# Patient Record
Sex: Female | Born: 1990 | Race: Black or African American | Hispanic: No | Marital: Single | Smoking: Never smoker
Health system: Southern US, Community
[De-identification: ages and names within clinical notes are randomized; demographics above are authoritative.]

## PROBLEM LIST (undated history)

## (undated) DIAGNOSIS — D571 Sickle-cell disease without crisis: Secondary | ICD-10-CM

## (undated) DIAGNOSIS — Z789 Other specified health status: Secondary | ICD-10-CM

## (undated) HISTORY — PX: NO PAST SURGERIES: SHX2092

## (undated) HISTORY — DX: Other specified health status: Z78.9

---

## 2013-03-10 ENCOUNTER — Emergency Department (HOSPITAL_COMMUNITY)
Admission: EM | Admit: 2013-03-10 | Discharge: 2013-03-11 | Disposition: A | Discharge status: Home / Self Care | Payer: BC Managed Care – PPO | Attending: Emergency Medicine | Admitting: Emergency Medicine

## 2013-03-10 DIAGNOSIS — R51 Headache: Secondary | ICD-10-CM | POA: Insufficient documentation

## 2013-03-10 DIAGNOSIS — J029 Acute pharyngitis, unspecified: Secondary | ICD-10-CM | POA: Insufficient documentation

## 2013-03-10 DIAGNOSIS — R05 Cough: Secondary | ICD-10-CM | POA: Insufficient documentation

## 2013-03-10 DIAGNOSIS — R059 Cough, unspecified: Secondary | ICD-10-CM | POA: Insufficient documentation

## 2013-03-10 DIAGNOSIS — R509 Fever, unspecified: Secondary | ICD-10-CM | POA: Insufficient documentation

## 2013-03-10 DIAGNOSIS — D571 Sickle-cell disease without crisis: Secondary | ICD-10-CM | POA: Insufficient documentation

## 2013-03-10 DIAGNOSIS — B9789 Other viral agents as the cause of diseases classified elsewhere: Secondary | ICD-10-CM | POA: Insufficient documentation

## 2013-03-10 DIAGNOSIS — R000 Tachycardia, unspecified: Secondary | ICD-10-CM | POA: Insufficient documentation

## 2013-03-10 DIAGNOSIS — B349 Viral infection, unspecified: Secondary | ICD-10-CM

## 2013-03-10 HISTORY — DX: Sickle-cell disease without crisis: D57.1

## 2013-03-10 NOTE — ED Notes (Signed)
As per EMS, pt sts she has sickle cell and her pain started this morning

## 2013-03-11 ENCOUNTER — Emergency Department (HOSPITAL_COMMUNITY): Payer: BC Managed Care – PPO

## 2013-03-11 ENCOUNTER — Encounter (HOSPITAL_COMMUNITY): Payer: Self-pay | Admitting: Emergency Medicine

## 2013-03-11 LAB — POCT I-STAT, CHEM 8
BUN: 10 mg/dL (ref 6–23)
CALCIUM ION: 1.34 mmol/L — AB (ref 1.12–1.23)
CREATININE: 1 mg/dL (ref 0.50–1.10)
Chloride: 100 mEq/L (ref 96–112)
GLUCOSE: 76 mg/dL (ref 70–99)
HEMATOCRIT: 45 % (ref 36.0–46.0)
HEMOGLOBIN: 15.3 g/dL — AB (ref 12.0–15.0)
Potassium: 3.2 mEq/L — ABNORMAL LOW (ref 3.7–5.3)
Sodium: 141 mEq/L (ref 137–147)
TCO2: 26 mmol/L (ref 0–100)

## 2013-03-11 LAB — CBC
HCT: 36.3 % (ref 36.0–46.0)
Hemoglobin: 12.9 g/dL (ref 12.0–15.0)
MCH: 27.3 pg (ref 26.0–34.0)
MCHC: 35.5 g/dL (ref 30.0–36.0)
MCV: 76.7 fL — ABNORMAL LOW (ref 78.0–100.0)
PLATELETS: 182 10*3/uL (ref 150–400)
RBC: 4.73 MIL/uL (ref 3.87–5.11)
RDW: 14.5 % (ref 11.5–15.5)
WBC: 6.4 10*3/uL (ref 4.0–10.5)

## 2013-03-11 LAB — RETICULOCYTES
RBC.: 4.73 MIL/uL (ref 3.87–5.11)
RETIC CT PCT: 1.4 % (ref 0.4–3.1)
Retic Count, Absolute: 66.2 10*3/uL (ref 19.0–186.0)

## 2013-03-11 MED ORDER — IBUPROFEN 800 MG PO TABS: 800.0000 mg | ORAL_TABLET | Freq: Three times a day (TID) | ORAL | Status: DC

## 2013-03-11 MED ORDER — KETOROLAC TROMETHAMINE 30 MG/ML IJ SOLN
30.0000 mg | Freq: Once | INTRAMUSCULAR | Status: AC
  Administered 2013-03-11: 30 mg via INTRAVENOUS
  Filled 2013-03-11: qty 1

## 2013-03-11 MED ORDER — ACETAMINOPHEN 325 MG PO TABS
650.0000 mg | ORAL_TABLET | Freq: Once | ORAL | Status: AC
  Administered 2013-03-11: 650 mg via ORAL
  Filled 2013-03-11: qty 2

## 2013-03-11 MED ORDER — OSELTAMIVIR PHOSPHATE 75 MG PO CAPS: 75.0000 mg | ORAL_CAPSULE | Freq: Two times a day (BID) | ORAL | Status: DC

## 2013-03-11 MED ORDER — OSELTAMIVIR PHOSPHATE 75 MG PO CAPS
75.0000 mg | ORAL_CAPSULE | Freq: Two times a day (BID) | ORAL | Status: DC
  Administered 2013-03-11: 75 mg via ORAL
  Filled 2013-03-11 (×3): qty 1

## 2013-03-11 MED ORDER — POTASSIUM CHLORIDE CRYS ER 20 MEQ PO TBCR
40.0000 meq | EXTENDED_RELEASE_TABLET | Freq: Once | ORAL | Status: AC
Start: 2013-03-11 — End: 2013-03-11
  Administered 2013-03-11: 40 meq via ORAL
  Filled 2013-03-11: qty 2

## 2013-03-11 MED ORDER — SODIUM CHLORIDE 0.9 % IV BOLUS (SEPSIS)
1000.0000 mL | Freq: Once | INTRAVENOUS | Status: AC
  Administered 2013-03-11: 1000 mL via INTRAVENOUS

## 2013-03-11 NOTE — Discharge Instructions (Signed)

## 2013-03-11 NOTE — ED Provider Notes (Signed)
CSN: [id number]     Arrival date & time 03/10/13  2354 History   First MD Initiated Contact with Patient 03/11/13 0017     Chief Complaint  Patient presents with  . Generalized Body Aches   (Consider location/radiation/quality/duration/timing/severity/associated sxs/prior Treatment) HPI History provided by patient. Onset of symptoms last night. Complaining of generalized bodyaches, chills, sore throat, cough and headache. Family member/ child sick contact at home. Patient has history of sickle cell anemia, followed by primary care physician at [facility], denies history of sickle cell pain crisis. She does not take any home medications. She denies any abdominal pain, chest pain or difficulty breathing. No productive sputum. No hemoptysis. She did not get a flu shot this season. Past Medical History  Diagnosis Date  . Sickle cell anemia    History reviewed. No pertinent past surgical history. History reviewed. No pertinent family history. History  Substance Use Topics  . Smoking status: Never Smoker   . Smokeless tobacco: Not on file  . Alcohol Use: No   OB History   Grav Para Term Preterm Abortions TAB SAB Ect Mult Living                 Review of Systems  Constitutional: Positive for fever and chills.  HENT: Positive for sore throat. Negative for trouble swallowing.   Respiratory: Negative for shortness of breath.   Cardiovascular: Negative for chest pain.  Gastrointestinal: Negative for abdominal pain.  Genitourinary: Negative for dysuria.  Musculoskeletal: Positive for myalgias. Negative for joint swelling.  Skin: Negative for rash.  Neurological: Positive for headaches.  All other systems reviewed and are negative.    Allergies  Review of patient's allergies indicates no known allergies.  Home Medications   Current Outpatient Rx  Name  Route  Sig  Dispense  Refill  . acetaminophen (TYLENOL) 500 MG tablet   Oral   Take 500-1,000 mg by mouth every 6 (six) hours as  needed for mild pain or headache.          BP 140/83  Pulse 109  Temp(Src) 100.6 F (38.1 C) (Rectal)  Resp 18  Ht 5\' 3"  (1.6 m)  Wt 130 lb (58.968 kg)  BMI 23.03 kg/m2  SpO2 100%  LMP 02/08/2013 Physical Exam  Constitutional: She is oriented to person, place, and time. She appears well-developed and well-nourished.  HENT:  Head: Normocephalic and atraumatic.  Mouth/Throat: No oropharyngeal exudate.  Eyes: EOM are normal. Pupils are equal, round, and reactive to light. No scleral icterus.  Neck: Normal range of motion. Neck supple.  Cardiovascular: Regular rhythm and intact distal pulses.   Tachycardic  Pulmonary/Chest: Effort normal and breath sounds normal. No respiratory distress. She exhibits no tenderness.  Abdominal: Soft. She exhibits no distension. There is no tenderness.  Musculoskeletal: Normal range of motion. She exhibits no edema.  No cervical, thoracic or lumbar tenderness. No joint swelling. No areas of soft tissue tenderness with distal neurovascular intact x4  Neurological: She is alert and oriented to person, place, and time.  Skin: Skin is warm and dry. No rash noted.    ED Course  Procedures (including critical care time) Labs Review Labs Reviewed  CBC - Abnormal; Notable for the following:    MCV 76.7 (*)    All other components within normal limits  POCT I-STAT, CHEM 8 - Abnormal; Notable for the following:    Potassium 3.2 (*)    Calcium, Ion 1.34 (*)    Hemoglobin 15.3 (*)  All other components within normal limits  RETICULOCYTES   Imaging Review Dg Chest 2 View  03/11/2013   CLINICAL DATA:  Flu symptoms, body aches, sickle cell  EXAM: CHEST  2 VIEW  COMPARISON:  None.  FINDINGS: The heart size and mediastinal contours are within normal limits. Both lungs are clear. The visualized skeletal structures are unremarkable.  IMPRESSION: No active cardiopulmonary disease.   Electronically Signed   By: Ruel Favorsrevor  Shick M.D.   On: 03/11/2013 01:12   IV  fluids. IV Toradol. Tylenol provided.  On recheck patient feeling much better, no UTI symptoms. Patient is comfortable with plan discharge home. Prescription for Motrin provided. Rx for Tamiflu provided. Return precautions verbalized as  understood MDM  Diagnosis: Viral infection, history of sickle cell disease  URI symptoms, possible influenza evaluated with chest x-ray no infiltrate as above. Labs reviewed, mild hypokalemia, no leukocytosis Improved with IV fluids and medications provided. Plan close primary care followup    Sunnie NielsenBrian Tamar Miano, MD 03/11/13 [phone]

## 2015-01-27 ENCOUNTER — Emergency Department (HOSPITAL_COMMUNITY)
Admission: EM | Admit: 2015-01-27 | Discharge: 2015-01-27 | Disposition: A | Discharge status: Home / Self Care | Payer: Self-pay | Attending: Emergency Medicine | Admitting: Emergency Medicine

## 2015-01-27 ENCOUNTER — Encounter (HOSPITAL_COMMUNITY): Payer: Self-pay

## 2015-01-27 DIAGNOSIS — R112 Nausea with vomiting, unspecified: Secondary | ICD-10-CM | POA: Insufficient documentation

## 2015-01-27 DIAGNOSIS — R1013 Epigastric pain: Secondary | ICD-10-CM | POA: Insufficient documentation

## 2015-01-27 DIAGNOSIS — Z862 Personal history of diseases of the blood and blood-forming organs and certain disorders involving the immune mechanism: Secondary | ICD-10-CM | POA: Insufficient documentation

## 2015-01-27 LAB — COMPREHENSIVE METABOLIC PANEL
ALBUMIN: 4.1 g/dL (ref 3.5–5.0)
ALT: 14 U/L (ref 14–54)
ANION GAP: 7 (ref 5–15)
AST: 19 U/L (ref 15–41)
Alkaline Phosphatase: 48 U/L (ref 38–126)
BUN: 7 mg/dL (ref 6–20)
CHLORIDE: 105 mmol/L (ref 101–111)
CO2: 26 mmol/L (ref 22–32)
Calcium: 9.5 mg/dL (ref 8.9–10.3)
Creatinine, Ser: 0.68 mg/dL (ref 0.44–1.00)
GFR calc Af Amer: >60 mL/min (ref >60)
GFR calc non Af Amer: >60 mL/min (ref >60)
Glucose, Bld: 98 mg/dL (ref 65–99)
Potassium: 3.6 mmol/L (ref 3.5–5.1)
SODIUM: 138 mmol/L (ref 135–145)
Total Bilirubin: 0.9 mg/dL (ref 0.3–1.2)
Total Protein: 7.9 g/dL (ref 6.5–8.1)

## 2015-01-27 LAB — CBC
HEMATOCRIT: 34.7 % — AB (ref 36.0–46.0)
HEMOGLOBIN: 11.9 g/dL — AB (ref 12.0–15.0)
MCH: 26.2 pg (ref 26.0–34.0)
MCHC: 34.3 g/dL (ref 30.0–36.0)
MCV: 76.4 fL — AB (ref 78.0–100.0)
Platelets: 217 10*3/uL (ref 150–400)
RBC: 4.54 MIL/uL (ref 3.87–5.11)
RDW: 15.2 % (ref 11.5–15.5)
WBC: 9.7 10*3/uL (ref 4.0–10.5)

## 2015-01-27 LAB — URINE MICROSCOPIC-ADD ON: Bacteria, UA: NONE SEEN

## 2015-01-27 LAB — URINALYSIS, ROUTINE W REFLEX MICROSCOPIC
BILIRUBIN URINE: NEGATIVE
GLUCOSE, UA: NEGATIVE mg/dL
Ketones, ur: NEGATIVE mg/dL
NITRITE: POSITIVE — AB
PH: 8.5 — AB (ref 5.0–8.0)
Protein, ur: NEGATIVE mg/dL
SPECIFIC GRAVITY, URINE: 1.016 (ref 1.005–1.030)

## 2015-01-27 LAB — LIPASE, BLOOD: Lipase: 26 U/L (ref 11–51)

## 2015-01-27 MED ORDER — ONDANSETRON 4 MG PO TBDP
8.0000 mg | ORAL_TABLET | Freq: Once | ORAL | Status: AC
  Administered 2015-01-27: 8 mg via ORAL
  Filled 2015-01-27: qty 2

## 2015-01-27 MED ORDER — GI COCKTAIL ~~LOC~~
30.0000 mL | Freq: Once | ORAL | Status: AC
  Administered 2015-01-27: 30 mL via ORAL
  Filled 2015-01-27: qty 30

## 2015-01-27 MED ORDER — ONDANSETRON HCL 4 MG PO TABS: 4.0000 mg | ORAL_TABLET | Freq: Three times a day (TID) | ORAL | Status: DC | PRN

## 2015-01-27 MED ORDER — ACETAMINOPHEN 325 MG PO TABS
650.0000 mg | ORAL_TABLET | Freq: Once | ORAL | Status: AC
  Administered 2015-01-27: 650 mg via ORAL
  Filled 2015-01-27: qty 2

## 2015-01-27 NOTE — ED Notes (Signed)
Pt reports mid upper abdominal pain onset today associated with emesis x 3.

## 2015-01-27 NOTE — ED Provider Notes (Signed)
CSN: [id number]     Arrival date & time 01/27/15  1914 History   First MD Initiated Contact with Patient 01/27/15 2100     Chief Complaint  Patient presents with  . Abdominal Pain     (Consider location/radiation/quality/duration/timing/severity/associated sxs/prior Treatment) Patient is a 24 y.o. female presenting with abdominal pain. The history is provided by the patient. No language interpreter was used.  Abdominal Pain Pain location:  Epigastric Pain quality: sharp   Pain radiates to:  Does not radiate Pain severity:  Moderate Onset quality:  Gradual Timing:  Constant Chronicity:  New Associated symptoms: nausea and vomiting   Associated symptoms: no chest pain, no chills, no diarrhea, no fever and no shortness of breath   Associated symptoms comment:  Onset today around 3:00 pm of epigastric pain, nausea and vomiting. She reports 5 episodes nonbloody emesis since symptoms began. No fever, diarrhea. No SOB or cough. Pain does not radiate. No history of similar symptoms. She reports co-workers with similar symptoms.    Past Medical History  Diagnosis Date  . Sickle cell anemia (HCC)    History reviewed. No pertinent past surgical history. No family history on file. Social History  Substance Use Topics  . Smoking status: Never Smoker   . Smokeless tobacco: None  . Alcohol Use: No   OB History    No data available     Review of Systems  Constitutional: Negative for fever and chills.  Respiratory: Negative.  Negative for shortness of breath.   Cardiovascular: Negative.  Negative for chest pain.  Gastrointestinal: Positive for nausea, vomiting and abdominal pain. Negative for diarrhea.  Genitourinary: Negative.   Musculoskeletal: Negative.  Negative for myalgias.  Skin: Negative.   Neurological: Negative.       Allergies  Review of patient's allergies indicates no known allergies.  Home Medications   Prior to Admission medications   Medication Sig Start  Date End Date Taking? Authorizing Provider  ibuprofen (ADVIL,MOTRIN) 800 MG tablet Take 1 tablet (800 mg total) by mouth 3 (three) times daily. Patient not taking: Reported on 01/27/2015 03/11/13   Sunnie Nielsen, MD  oseltamivir (TAMIFLU) 75 MG capsule Take 1 capsule (75 mg total) by mouth every 12 (twelve) hours. Patient not taking: Reported on 01/27/2015 03/11/13   Sunnie Nielsen, MD   BP 114/68 mmHg  Pulse 89  Temp(Src) 98.7 F (37.1 C) (Oral)  Resp 16  SpO2 100% Physical Exam  Constitutional: She is oriented to person, place, and time. She appears well-developed and well-nourished.  HENT:  Head: Normocephalic.  Mouth/Throat: Mucous membranes are dry.  Neck: Normal range of motion. Neck supple.  Cardiovascular: Normal rate.   Pulmonary/Chest: Effort normal. She exhibits no tenderness.  Abdominal: Soft. Bowel sounds are normal. There is tenderness. There is no rebound and no guarding.  Tenderness limited to epigastrium.  Musculoskeletal: Normal range of motion.  Neurological: She is alert and oriented to person, place, and time.  Skin: Skin is warm and dry. No rash noted.  Psychiatric: She has a normal mood and affect.    ED Course  Procedures (including critical care time) Labs Review Labs Reviewed  CBC - Abnormal; Notable for the following:    Hemoglobin 11.9 (*)    HCT 34.7 (*)    MCV 76.4 (*)    All other components within normal limits  URINALYSIS, ROUTINE W REFLEX MICROSCOPIC (NOT AT [facility]) - Abnormal; Notable for the following:    APPearance CLOUDY (*)    pH 8.5 (*)  Hgb urine dipstick TRACE (*)    Nitrite POSITIVE (*)    Leukocytes, UA LARGE (*)    All other components within normal limits  URINE MICROSCOPIC-ADD ON - Abnormal; Notable for the following:    Squamous Epithelial / LPF 6-30 (*)    All other components within normal limits  LIPASE, BLOOD  COMPREHENSIVE METABOLIC PANEL  I-STAT BETA HCG BLOOD, ED ([facility], [facility], [facility] ONLY)   Results for orders placed or  performed during the hospital encounter of 01/27/15  Lipase, blood  Result Value Ref Range   Lipase 26 11 - 51 U/L  Comprehensive metabolic panel  Result Value Ref Range   Sodium 138 135 - 145 mmol/L   Potassium 3.6 3.5 - 5.1 mmol/L   Chloride 105 101 - 111 mmol/L   CO2 26 22 - 32 mmol/L   Glucose, Bld 98 65 - 99 mg/dL   BUN 7 6 - 20 mg/dL   Creatinine, Ser 1.610.68 0.44 - 1.00 mg/dL   Calcium 9.5 8.9 - 09.610.3 mg/dL   Total Protein 7.9 6.5 - 8.1 g/dL   Albumin 4.1 3.5 - 5.0 g/dL   AST 19 15 - 41 U/L   ALT 14 14 - 54 U/L   Alkaline Phosphatase 48 38 - 126 U/L   Total Bilirubin 0.9 0.3 - 1.2 mg/dL   GFR calc non Af Amer >60 >60 mL/min   GFR calc Af Amer >60 >60 mL/min   Anion gap 7 5 - 15  CBC  Result Value Ref Range   WBC 9.7 4.0 - 10.5 K/uL   RBC 4.54 3.87 - 5.11 MIL/uL   Hemoglobin 11.9 (L) 12.0 - 15.0 g/dL   HCT 04.534.7 (L) 40.936.0 - 81.146.0 %   MCV 76.4 (L) 78.0 - 100.0 fL   MCH 26.2 26.0 - 34.0 pg   MCHC 34.3 30.0 - 36.0 g/dL   RDW 91.415.2 78.211.5 - 95.615.5 %   Platelets 217 150 - 400 K/uL  Urinalysis, Routine w reflex microscopic (not [facility])  Result Value Ref Range   Color, Urine YELLOW YELLOW   APPearance CLOUDY (A) CLEAR   Specific Gravity, Urine 1.016 1.005 - 1.030   pH 8.5 (H) 5.0 - 8.0   Glucose, UA NEGATIVE NEGATIVE mg/dL   Hgb urine dipstick TRACE (A) NEGATIVE   Bilirubin Urine NEGATIVE NEGATIVE   Ketones, ur NEGATIVE NEGATIVE mg/dL   Protein, ur NEGATIVE NEGATIVE mg/dL   Nitrite POSITIVE (A) NEGATIVE   Leukocytes, UA LARGE (A) NEGATIVE  Urine microscopic-add on  Result Value Ref Range   Squamous Epithelial / LPF 6-30 (A) NONE SEEN   WBC, UA 0-5 0 - 5 WBC/hpf   RBC / HPF 0-5 0 - 5 RBC/hpf   Bacteria, UA NONE SEEN NONE SEEN   Urine-Other TRICHOMONAS PRESENT     Imaging Review No results found. I have personally reviewed and evaluated these images and lab results as part of my medical decision-making.   EKG Interpretation None      MDM   Final diagnoses:  None     1. Nausea, vomiting 2. Epigastric pain  Pain is improved with GI cocktail and nausea resolved with Zofran. She is tolerating PO fluids. Feel, with sick contacts, illness likely viral. She appears well and is appropriate for discharge home. Will provide Rx for zofran. Return precautions discussed.    Elpidio AnisShari Ginia Rudell, PA-C 01/27/15 2232  Blane OharaJoshua Zavitz, MD 01/31/15 [phone]

## 2015-01-27 NOTE — ED Notes (Signed)
PT requests med for headache. Provider notified.

## 2015-01-27 NOTE — Discharge Instructions (Signed)

## 2015-04-14 ENCOUNTER — Encounter (HOSPITAL_COMMUNITY): Payer: Self-pay | Admitting: Emergency Medicine

## 2015-04-14 ENCOUNTER — Emergency Department (HOSPITAL_COMMUNITY)
Admission: EM | Admit: 2015-04-14 | Discharge: 2015-04-14 | Disposition: A | Discharge status: Home / Self Care | Payer: Self-pay | Attending: Emergency Medicine | Admitting: Emergency Medicine

## 2015-04-14 DIAGNOSIS — N898 Other specified noninflammatory disorders of vagina: Secondary | ICD-10-CM | POA: Insufficient documentation

## 2015-04-14 DIAGNOSIS — Z202 Contact with and (suspected) exposure to infections with a predominantly sexual mode of transmission: Secondary | ICD-10-CM | POA: Insufficient documentation

## 2015-04-14 LAB — URINALYSIS, ROUTINE W REFLEX MICROSCOPIC
Bilirubin Urine: NEGATIVE
Glucose, UA: NEGATIVE mg/dL
Hgb urine dipstick: NEGATIVE
Ketones, ur: NEGATIVE mg/dL
NITRITE: POSITIVE — AB
PROTEIN: NEGATIVE mg/dL
Specific Gravity, Urine: 1.015 (ref 1.005–1.030)
pH: 6.5 (ref 5.0–8.0)

## 2015-04-14 LAB — URINE MICROSCOPIC-ADD ON

## 2015-04-14 LAB — POC URINE PREG, ED: PREG TEST UR: NEGATIVE

## 2015-04-14 NOTE — ED Notes (Signed)
Pt states she has been having a clear vaginal discharge for 3 days and would like to be tested to stds. Pt denies any abd pain or urinary symptoms.

## 2015-04-14 NOTE — ED Notes (Signed)
Pt at desk stating she is leaving due to wait, encouraged to stay, ambulatory without distress 

## 2016-05-10 DIAGNOSIS — F332 Major depressive disorder, recurrent severe without psychotic features: Secondary | ICD-10-CM | POA: Diagnosis not present

## 2016-05-18 DIAGNOSIS — Z0000 Encounter for general adult medical examination without abnormal findings: Secondary | ICD-10-CM | POA: Diagnosis not present

## 2016-05-18 DIAGNOSIS — N390 Urinary tract infection, site not specified: Secondary | ICD-10-CM | POA: Diagnosis not present

## 2016-06-30 DIAGNOSIS — N3000 Acute cystitis without hematuria: Secondary | ICD-10-CM | POA: Diagnosis not present

## 2016-06-30 DIAGNOSIS — F332 Major depressive disorder, recurrent severe without psychotic features: Secondary | ICD-10-CM | POA: Diagnosis not present

## 2016-07-21 DIAGNOSIS — F332 Major depressive disorder, recurrent severe without psychotic features: Secondary | ICD-10-CM | POA: Diagnosis not present

## 2016-08-23 ENCOUNTER — Emergency Department (HOSPITAL_COMMUNITY)
Admission: EM | Admit: 2016-08-23 | Discharge: 2016-08-23 | Disposition: A | Discharge status: Home / Self Care | Payer: BLUE CROSS/BLUE SHIELD | Attending: Emergency Medicine | Admitting: Emergency Medicine

## 2016-08-23 ENCOUNTER — Encounter (HOSPITAL_COMMUNITY): Payer: Self-pay | Admitting: Vascular Surgery

## 2016-08-23 DIAGNOSIS — R21 Rash and other nonspecific skin eruption: Secondary | ICD-10-CM | POA: Diagnosis present

## 2016-08-23 DIAGNOSIS — B86 Scabies: Secondary | ICD-10-CM | POA: Insufficient documentation

## 2016-08-23 DIAGNOSIS — D571 Sickle-cell disease without crisis: Secondary | ICD-10-CM | POA: Insufficient documentation

## 2016-08-23 MED ORDER — PERMETHRIN 5 % EX CREA: TOPICAL_CREAM | CUTANEOUS | 0 refills | Status: DC

## 2016-08-23 NOTE — ED Provider Notes (Signed)
[facility]-EMERGENCY DEPT Provider Note   CSN: [id number] Arrival date & time: 08/23/16  1243   By signing my name below, I, Soijett Blue, attest that this documentation has been prepared under the direction and in the presence of Burna Forts, PA-C Electronically Signed: Soijett Blue, ED Scribe. 08/23/16. 3:16 PM.  History   Chief Complaint Chief Complaint  Patient presents with  . Rash    HPI  Paige Mack is a 26 y.o. female with a PMHx of sickle cell anemia, who presents to the Emergency Department complaining of pruritic rash localized to bilateral hands, bilateral feet, BUE, and BLE onset 2 days ago. Pt reports associated transient redness to affected areas. Pt has tried calamine lotion x last night, milk of magnesia, hot water, with no relief of her symptoms. Pt reports that there is an intermittent burning sensation to the affected areas. She notes that she was outside prior to the onset of her symptoms. Pt reports that her rash is worsened at night. She states that she was recently in jail from 7/3-7/8 and noticed itching following being released from jail. Pt denies new soaps, medications, pets, environment, lotion, or detergent. She denies wound, hives, fever, chills, activity change, and any other symptoms.     The history is provided by the patient. No language interpreter was used.    Past Medical History:  Diagnosis Date  . Sickle cell anemia (HCC)     There are no active problems to display for this patient.   History reviewed. No pertinent surgical history.  OB History    No data available       Home Medications    Prior to Admission medications   Medication Sig Start Date End Date Taking? Authorizing Provider  ibuprofen (ADVIL,MOTRIN) 800 MG tablet Take 1 tablet (800 mg total) by mouth 3 (three) times daily. Patient not taking: Reported on 01/27/2015 03/11/13   Sunnie Nielsen, MD  ondansetron (ZOFRAN) 4 MG tablet Take 1 tablet (4 mg total) by mouth every 8  (eight) hours as needed for nausea or vomiting. 01/27/15   Elpidio Anis, PA-C  oseltamivir (TAMIFLU) 75 MG capsule Take 1 capsule (75 mg total) by mouth every 12 (twelve) hours. Patient not taking: Reported on 01/27/2015 03/11/13   Sunnie Nielsen, MD  permethrin Verner Mould) 5 % cream Apply to affected area once 08/23/16   Eyvonne Mechanic, PA-C    Family History No family history on file.  Social History Social History  Substance Use Topics  . Smoking status: Never Smoker  . Smokeless tobacco: Never Used  . Alcohol use No     Allergies   Patient has no known allergies.   Review of Systems Review of Systems  Constitutional: Negative for chills and fever.  Skin: Positive for color change (transient redness to affected areas) and rash (bilateral hands, bilateral feet, BUE, and BLE). Negative for wound.     Physical Exam Updated Vital Signs BP (!) 123/92 (BP Location: Right Arm)   Pulse 73   Temp 98.4 F (36.9 C) (Oral)   Resp 16   Ht 5\' 3"  (1.6 m)   Wt 148 lb (67.1 kg)   SpO2 100%   BMI 26.22 kg/m   Physical Exam  Constitutional: She is oriented to person, place, and time. She appears well-developed and well-nourished. No distress.  HENT:  Head: Normocephalic and atraumatic.  Eyes: EOM are normal.  Neck: Neck supple.  Cardiovascular: Normal rate.   Pulmonary/Chest: Effort normal. No respiratory distress.  Abdominal: She  exhibits no distension.  Musculoskeletal: Normal range of motion.  Neurological: She is alert and oriented to person, place, and time.  Skin: Skin is warm and dry. No rash noted.  No rash noted.   Psychiatric: She has a normal mood and affect. Her behavior is normal.  Nursing note and vitals reviewed.    ED Treatments / Results  DIAGNOSTIC STUDIES: Oxygen Saturation is 100% on RA, nl by my interpretation.    COORDINATION OF CARE: 3:03 PM Discussed treatment plan with pt at bedside and pt agreed to plan.   Labs (all labs ordered are listed,  but only abnormal results are displayed) Labs Reviewed - No data to display  EKG  EKG Interpretation None       Radiology No results found.  Procedures Procedures (including critical care time)  Medications Ordered in ED Medications - No data to display   Initial Impression / Assessment and Plan / ED Course  I have reviewed the triage vital signs and the nursing notes.  Pertinent labs & imaging results that were available during my care of the patient were reviewed by me and considered in my medical decision making (see chart for details).      Labs:   Imaging:  Consults:  Therapeutics:  Discharge Meds:   Assessment/Plan: . Pt presents with generalized pruritic rash s/p 5 day incarceration. Discussed diagnosis & treatment of scabies with patient. They have been advised to followup with her primary care doctor 2 weeks after treatment.  They have also been advised to clean entire household including washing sheets in the hottest water possible and using R.I.D. spray in the car and on sofa. Discussed the proper application of permethrin cream.  Patient  advised to repeat treatment in one week. No signs of secondary infection.  Discussed return precautions. Patient advised to follow up with PCP for unresolved symptoms. Patient appears safe for discharge.  Final Clinical Impressions(s) / ED Diagnoses   Final diagnoses:  Scabies    New Prescriptions Discharge Medication List as of 08/23/2016  3:34 PM    START taking these medications   Details  permethrin (ELIMITE) 5 % cream Apply to affected area once, Print       I personally performed the services described in this documentation, which was scribed in my presence. The recorded information has been reviewed and is accurate.    Eyvonne MechanicHedges, Lupie Sawa, PA-C 08/25/16 1841    Vanetta MuldersZackowski, Scott, MD 08/29/16 [phone]

## 2016-08-23 NOTE — ED Notes (Signed)
Pt is in stable condition upon d/c and ambulates from ED. 

## 2016-08-23 NOTE — Discharge Instructions (Signed)
Please read attached information. If you experience any new or worsening signs or symptoms please return to the emergency room for evaluation. Please follow-up with your primary care provider or specialist as discussed. Please use medication prescribed only as directed and discontinue taking if you have any concerning signs or symptoms.   °

## 2016-08-23 NOTE — ED Triage Notes (Signed)
Pt reports to the ED for eval of generalized itchy rash x 2 days. She denies any new soaps, detergents, foods, medications, lotions, or makeup. Denies any oral swelling or SOB. No hives visible at this time. Tried Calamine lotion without relief.

## 2016-09-10 ENCOUNTER — Encounter (HOSPITAL_COMMUNITY): Payer: Self-pay

## 2016-09-10 ENCOUNTER — Emergency Department (HOSPITAL_COMMUNITY)
Admission: EM | Admit: 2016-09-10 | Discharge: 2016-09-10 | Disposition: A | Discharge status: Home / Self Care | Payer: BLUE CROSS/BLUE SHIELD | Attending: Emergency Medicine | Admitting: Emergency Medicine

## 2016-09-10 DIAGNOSIS — R21 Rash and other nonspecific skin eruption: Secondary | ICD-10-CM | POA: Diagnosis not present

## 2016-09-10 DIAGNOSIS — Z862 Personal history of diseases of the blood and blood-forming organs and certain disorders involving the immune mechanism: Secondary | ICD-10-CM | POA: Insufficient documentation

## 2016-09-10 DIAGNOSIS — L299 Pruritus, unspecified: Secondary | ICD-10-CM

## 2016-09-10 MED ORDER — PERMETHRIN 5 % EX CREA: TOPICAL_CREAM | CUTANEOUS | 0 refills | Status: DC

## 2016-09-10 MED ORDER — PREDNISONE 20 MG PO TABS
20.0000 mg | ORAL_TABLET | Freq: Once | ORAL | Status: AC
  Administered 2016-09-10: 20 mg via ORAL
  Filled 2016-09-10: qty 1

## 2016-09-10 MED ORDER — PREDNISONE 10 MG PO TABS: 20.0000 mg | ORAL_TABLET | Freq: Every day | ORAL | 0 refills | Status: AC

## 2016-09-10 MED ORDER — HYDROXYZINE HCL 25 MG PO TABS: 25.0000 mg | ORAL_TABLET | Freq: Four times a day (QID) | ORAL | 0 refills | Status: DC | PRN

## 2016-09-10 MED ORDER — HYDROXYZINE HCL 25 MG PO TABS
25.0000 mg | ORAL_TABLET | Freq: Once | ORAL | Status: AC
Start: 2016-09-10 — End: 2016-09-10
  Administered 2016-09-10: 25 mg via ORAL
  Filled 2016-09-10: qty 1

## 2016-09-10 NOTE — ED Provider Notes (Signed)
Emergency Department Provider Note  I have reviewed the triage vital signs and the nursing notes.  By signing my name below, I, Bridgette HabermannMaria Tan, attest that this documentation has been prepared under the direction and in the presence of Eugena Rhue, Arlyss RepressJoshua G, MD. Electronically Signed: Bridgette HabermannMaria Tan, ED Scribe. 09/10/16. 7:52 PM.  HISTORY  Chief Complaint Rash  HPI HPI Comments: Paige Mack is a 26 y.o. female with h/o sickle cell anemia, who presents to the Emergency Department complaining of a generalized, pruritic rash beginning last night. Pt reports that she was seen here two weeks ago for a rash that she states is exactly the same as her current rash and diagnosed with scabies. She was prescribed permethrin cream which gave her complete relief. At the onset of her rash again last night, she tried the cream which made her current rash worse. Pt further reports she took a Benadryl last night and this morning with no relief. No new soaps, lotions, detergents, foods, animals, plants, or medications. Pt denies fever, chills, or any other associated symptoms.  Past Medical History:  Diagnosis Date  . Sickle cell anemia (HCC)     There are no active problems to display for this patient.   History reviewed. No pertinent surgical history.  Current Outpatient Rx  . Order #: [id number] Class: Print  . Order #: [id number] Class: Print  . Order #: [id number] Class: Print  . Order #: [id number] Class: Print  . Order #: [id number] Class: Print  . [START ON 09/11/2016] Order #: [id number] Class: Print    Allergies Patient has no known allergies.  No family history on file.  Social History Social History  Substance Use Topics  . Smoking status: Never Smoker  . Smokeless tobacco: Never Used  . Alcohol use No    Review of Systems  Constitutional: No fever/chills Eyes: No visual changes. ENT: No sore throat. Cardiovascular: Denies chest pain. Respiratory: Denies shortness of breath. Gastrointestinal:  No abdominal pain.  No nausea, no vomiting.  No diarrhea.  No constipation. Musculoskeletal: Negative for back pain. Skin: Positive for itchy rash. Neurological: Negative for headaches, focal weakness or numbness.  10-point ROS otherwise negative.  ____________________________________________   PHYSICAL EXAM:  VITAL SIGNS: ED Triage Vitals  Enc Vitals Group     BP 09/10/16 1842 119/68     Pulse Rate 09/10/16 1840 100     Resp 09/10/16 1840 16     Temp 09/10/16 1840 97.7 F (36.5 C)     Temp Source 09/10/16 1840 Oral     SpO2 09/10/16 1840 100 %     Weight 09/10/16 1840 148 lb (67.1 kg)     Height 09/10/16 1840 5\' 3"  (1.6 m)     Pain Score 09/10/16 1839 0   Constitutional: Alert and oriented. Well appearing and in no acute distress. Eyes: Conjunctivae are normal.  Head: Atraumatic. Nose: No congestion/rhinnorhea. Mouth/Throat: Mucous membranes are moist.  Oropharynx non-erythematous. Neck: No stridor.   Cardiovascular: Normal rate, regular rhythm. Good peripheral circulation. Grossly normal heart sounds.   Respiratory: Normal respiratory effort.  No retractions. Lungs CTAB.  Musculoskeletal: No lower extremity tenderness nor edema. No gross deformities of extremities. Neurologic:  Normal speech and language. No gross focal neurologic deficits are appreciated.  Skin:  Skin is warm, dry and intact. Faint erythema over the forearms. No petechiae. No rash in the web spaces of hands. No purulent drainage or abscess.  ____________________________________________   PROCEDURES  Procedure(s) performed:   Procedures  None ____________________________________________   INITIAL IMPRESSION /  ASSESSMENT AND PLAN / ED COURSE  Pertinent labs & imaging results that were available during my care of the patient were reviewed by me and considered in my medical decision making (see chart for details).  Patient resents to the emergency room for evaluation of itching and return of  rash. She was treated for scabies recently and symptoms resolved entirely. They have since returned she tried reapplying her permethrin cream with worsening symptoms. No other signs or symptoms to suggest severe allergic reaction. The rash is faint and seems more consistent with multiple areas of excoriation. Plan for brief steroid course, Atarax for itching, and continue applying permethrin cream if rash develops between the fingers or return to a more significant way to the forearm or feet.   At this time, I do not feel there is any life-threatening condition present. I have reviewed and discussed all results (EKG, imaging, lab, urine as appropriate), exam findings with patient. I have reviewed nursing notes and appropriate previous records.  I feel the patient is safe to be discharged home without further emergent workup. Discussed usual and customary return precautions. Patient and family (if present) verbalize understanding and are comfortable with this plan.  Patient will follow-up with their primary care provider. If they do not have a primary care provider, information for follow-up has been provided to them. All questions have been answered.    ____________________________________________  FINAL CLINICAL IMPRESSION(S) / ED DIAGNOSES  Final diagnoses:  Rash  Itching     MEDICATIONS GIVEN DURING THIS VISIT:  Medications  hydrOXYzine (ATARAX/VISTARIL) tablet 25 mg (not administered)  predniSONE (DELTASONE) tablet 20 mg (not administered)     NEW OUTPATIENT MEDICATIONS STARTED DURING THIS VISIT:  New Prescriptions   HYDROXYZINE (ATARAX/VISTARIL) 25 MG TABLET    Take 1 tablet (25 mg total) by mouth every 6 (six) hours as needed for itching.   PREDNISONE (DELTASONE) 10 MG TABLET    Take 2 tablets (20 mg total) by mouth daily.      Note:  This document was prepared using Dragon voice recognition software and may include unintentional dictation errors.  Alona BeneJoshua Danelly Hassinger, MD Emergency  Medicine  I personally performed the services described in this documentation, which was scribed in my presence. The recorded information has been reviewed and is accurate.       Maia PlanLong, Neiman Roots G, MD 09/10/16 [id number]

## 2016-09-10 NOTE — ED Triage Notes (Signed)
Per Pt, pt is coming from home with complaints of generalized itching that started last night. Pt reports that she was diagnosed with scabies two weeks ago and the treatment caused the rash to go away, but it is back.

## 2016-09-10 NOTE — Discharge Instructions (Signed)
You were seen in the ED today with rash. This could be a return of your scabies and you can continue using the cream. I am starting you on steroids for the next 4 days along with Atarax to take as needed for itching. Follow up with your PCP.

## 2018-03-21 ENCOUNTER — Emergency Department (HOSPITAL_COMMUNITY)
Admission: EM | Admit: 2018-03-21 | Discharge: 2018-03-21 | Disposition: A | Discharge status: Home / Self Care | Payer: BLUE CROSS/BLUE SHIELD | Attending: Emergency Medicine | Admitting: Emergency Medicine

## 2018-03-21 ENCOUNTER — Other Ambulatory Visit: Payer: Self-pay

## 2018-03-21 ENCOUNTER — Encounter (HOSPITAL_COMMUNITY): Payer: Self-pay

## 2018-03-21 DIAGNOSIS — Z711 Person with feared health complaint in whom no diagnosis is made: Secondary | ICD-10-CM

## 2018-03-21 DIAGNOSIS — Z202 Contact with and (suspected) exposure to infections with a predominantly sexual mode of transmission: Secondary | ICD-10-CM | POA: Insufficient documentation

## 2018-03-21 DIAGNOSIS — Z79899 Other long term (current) drug therapy: Secondary | ICD-10-CM | POA: Insufficient documentation

## 2018-03-21 LAB — WET PREP, GENITAL
CLUE CELLS WET PREP: NONE SEEN
Sperm: NONE SEEN
TRICH WET PREP: NONE SEEN
Yeast Wet Prep HPF POC: NONE SEEN

## 2018-03-21 LAB — PREGNANCY, URINE: PREG TEST UR: NEGATIVE

## 2018-03-21 MED ORDER — LIDOCAINE HCL 1 % IJ SOLN
INTRAMUSCULAR | Status: AC
  Administered 2018-03-21: 2.1 mL
  Filled 2018-03-21: qty 20

## 2018-03-21 MED ORDER — CEFTRIAXONE SODIUM 1 G IJ SOLR
1.0000 g | Freq: Once | INTRAMUSCULAR | Status: AC
  Administered 2018-03-21: 1 g via INTRAMUSCULAR
  Filled 2018-03-21: qty 10

## 2018-03-21 MED ORDER — AZITHROMYCIN 250 MG PO TABS
1000.0000 mg | ORAL_TABLET | Freq: Once | ORAL | Status: AC
  Administered 2018-03-21: 1000 mg via ORAL
  Filled 2018-03-21: qty 4

## 2018-03-21 NOTE — Discharge Instructions (Signed)

## 2018-03-21 NOTE — ED Provider Notes (Signed)
[facility]-EMERGENCY DEPT Provider Note   CSN: [id number] Arrival date & time: 03/21/18  1117     History   Chief Complaint Chief Complaint  Patient presents with  . Exposure to STD    HPI Paige Mack is a 28 y.o. female with PMH/o sickle cell anemia who presents for evaluation of concerns of possible STD exposure.  Patient reports that a few days ago, she had sexual intercourse with a former partner.  She reports that today, she was called by the partner and stated that he was having some penile discharge and that she needed to go be tested.  She reports that he has not gotten the results of his test back but she was concerned so she came to the emergency department.  She states that she has not had any symptoms and denies any vaginal discharge, dysuria, hematuria.  Patient reports that she only has 1 sexual partner and they do not use any protection.  She states that her partner has more than one partner.  Patient denies any history of STDs.  The history is provided by the patient.    Past Medical History:  Diagnosis Date  . Sickle cell anemia (HCC)     There are no active problems to display for this patient.   History reviewed. No pertinent surgical history.   OB History   No obstetric history on file.      Home Medications    Prior to Admission medications   Medication Sig Start Date End Date Taking? Authorizing Provider  Black Elderberry,Berry-Flower, 575 MG CAPS Take 1 capsule by mouth daily.   Yes [provider]  Maca Root 500 MG CAPS Take 1 capsule by mouth daily.   Yes [provider]  milk thistle 175 MG tablet Take 175 mg by mouth daily.   Yes [provider]  Probiotic Product (PROBIOTIC-10 PO) Take 1 capsule by mouth daily.   Yes [provider]  hydrOXYzine (ATARAX/VISTARIL) 25 MG tablet Take 1 tablet (25 mg total) by mouth every 6 (six) hours as needed for itching. Patient not taking: Reported on  03/21/2018 09/10/16   Long, Arlyss RepressJoshua G, MD  ibuprofen (ADVIL,MOTRIN) 800 MG tablet Take 1 tablet (800 mg total) by mouth 3 (three) times daily. Patient not taking: Reported on 03/21/2018 03/11/13   Sunnie Nielsenpitz, Brian, MD  ondansetron (ZOFRAN) 4 MG tablet Take 1 tablet (4 mg total) by mouth every 8 (eight) hours as needed for nausea or vomiting. Patient not taking: Reported on 03/21/2018 01/27/15   Elpidio AnisUpstill, Shari, PA-C  oseltamivir (TAMIFLU) 75 MG capsule Take 1 capsule (75 mg total) by mouth every 12 (twelve) hours. Patient not taking: Reported on 03/21/2018 03/11/13   Sunnie Nielsenpitz, Brian, MD  permethrin Verner Mould(ELIMITE) 5 % cream Apply to affected area once Patient not taking: Reported on 03/21/2018 09/10/16   Long, Arlyss RepressJoshua G, MD    Family History No family history on file.  Social History Social History   Tobacco Use  . Smoking status: Never Smoker  . Smokeless tobacco: Never Used  Substance Use Topics  . Alcohol use: Yes    Comment: socially  . Drug use: No     Allergies   Patient has no known allergies.   Review of Systems Review of Systems  Constitutional: Negative for fever.  Genitourinary: Negative for dysuria, hematuria, vaginal bleeding and vaginal discharge.  All other systems reviewed and are negative.    Physical Exam Updated Vital Signs BP 136/87 (BP Location: Left  Arm)   Pulse 79   Temp 98.3 F (36.8 C) (Oral)   Resp 17   Ht 5\' 3"  (1.6 m)   Wt 73.9 kg   LMP 03/09/2018   SpO2 100%   BMI 28.87 kg/m   Physical Exam Vitals signs and nursing note reviewed. Exam conducted with a chaperone present.  Constitutional:      Appearance: She is well-developed.  HENT:     Head: Normocephalic and atraumatic.  Eyes:     General: No scleral icterus.       Right eye: No discharge.        Left eye: No discharge.     Conjunctiva/sclera: Conjunctivae normal.  Pulmonary:     Effort: Pulmonary effort is normal.  Abdominal:     General: Abdomen is flat.     Palpations: Abdomen is soft.      Tenderness: There is no abdominal tenderness.  Genitourinary:    Vagina: Vaginal discharge present. No bleeding.     Cervix: No cervical motion tenderness.     Uterus: Normal.      Adnexa: Right adnexa normal and left adnexa normal.       Right: No mass or tenderness.         Left: No mass or tenderness.       Comments: The exam was performed with a chaperone present. Normal external female genitalia. No lesions, rash, or sores.  Small amount of thin white discharge noted in vaginal vault.  No cervical friability.  No CMT.  No adnexal mass or tenderness bilaterally. Skin:    General: Skin is warm and dry.  Neurological:     Mental Status: She is alert.  Psychiatric:        Speech: Speech normal.        Behavior: Behavior normal.      ED Treatments / Results  Labs (all labs ordered are listed, but only abnormal results are displayed) Labs Reviewed  WET PREP, GENITAL - Abnormal; Notable for the following components:      Result Value   WBC, Wet Prep HPF POC MODERATE (*)    All other components within normal limits  PREGNANCY, URINE  HIV ANTIBODY (ROUTINE TESTING W REFLEX)  RPR  POC URINE PREG, ED  GC/CHLAMYDIA PROBE AMP ([place]) NOT AT [facility]    EKG None  Radiology No results found.  Procedures Procedures (including critical care time)  Medications Ordered in ED Medications  cefTRIAXone (ROCEPHIN) injection 1 g (1 g Intramuscular Given 03/21/18 1727)  azithromycin (ZITHROMAX) tablet 1,000 mg (1,000 mg Oral Given 03/21/18 1727)  lidocaine (XYLOCAINE) 1 % (with pres) injection (2.1 mLs  Given 03/21/18 1732)     Initial Impression / Assessment and Plan / ED Course  I have reviewed the triage vital signs and the nursing notes.  Pertinent labs & imaging results that were available during my care of the patient were reviewed by me and considered in my medical decision making (see chart for details).     28 year old female with no significant past medical history who  presents for evaluation of possible STD exposure.  Patient reports that she had sexual intercourse with a previous partner.  She was called today and told that he was having some penile discharge.  She does report that he has more than 1 partners.  She has not had any symptoms. Patient is afebrile, non-toxic appearing, sitting comfortably on examination table. Vital signs reviewed and stable.  Benign abdominal exam.  Will  plan for pelvic exam.  Patient requesting testing for gonorrhea/media, HIV, syphilis.  Pelvic exam as document above.  Pelvic exam not concerning for PID.  I discussed treatment options with patient.  She would rather go to be treated today.  Patient with no known drug allergies.   Prep shows no evidence of clue cells.  Discussed results with patient. At this time, patient exhibits no emergent life-threatening condition that require further evaluation in ED or admission.  Encouraged safe sex practices. At this time, patient exhibits no emergent life-threatening condition that require further evaluation in ED. Patient had ample opportunity for questions and discussion. All patient's questions were answered with full understanding. Strict return precautions discussed. Patient expresses understanding and agreement to plan.    Portions of this note were generated with Scientist, clinical (histocompatibility and immunogenetics)Dragon dictation software. Dictation errors may occur despite best attempts at proofreading.  Final Clinical Impressions(s) / ED Diagnoses   Final diagnoses:  Concern about STD in female without diagnosis    ED Discharge Orders    None       Rosana HoesLayden, Armani Gawlik A, PA-C 03/21/18 1735    Jacalyn LefevreHaviland, Julie, MD 03/21/18 [phone]

## 2018-03-21 NOTE — ED Triage Notes (Signed)
Pt states that she got back with an ex on Saturday, who called her today and said that she needed to get test for STDs.  Pt states she has not had any symptoms.

## 2018-03-22 LAB — GC/CHLAMYDIA PROBE AMP (~~LOC~~) NOT AT ARMC
CHLAMYDIA, DNA PROBE: NEGATIVE
Neisseria Gonorrhea: NEGATIVE

## 2018-03-22 LAB — HIV ANTIBODY (ROUTINE TESTING W REFLEX): HIV Screen 4th Generation wRfx: NONREACTIVE

## 2018-03-22 LAB — RPR: RPR Ser Ql: NONREACTIVE

## 2018-03-22 NOTE — ED Notes (Signed)
03/22/2018. Pt. Called for STD results, they have not resulted.  I explained to her she will receive a call if Positive.  I also informed her that she can call  Tomorrow, 03/23/2018 and check on results. She verbalized understanding.

## 2018-09-08 ENCOUNTER — Emergency Department (HOSPITAL_COMMUNITY): Payer: Self-pay

## 2018-09-08 ENCOUNTER — Other Ambulatory Visit: Payer: Self-pay

## 2018-09-08 ENCOUNTER — Emergency Department (HOSPITAL_COMMUNITY)
Admission: EM | Admit: 2018-09-08 | Discharge: 2018-09-08 | Discharge status: Against Medical Advice | Payer: Self-pay | Attending: Emergency Medicine | Admitting: Emergency Medicine

## 2018-09-08 ENCOUNTER — Encounter (HOSPITAL_COMMUNITY): Payer: Self-pay | Admitting: *Deleted

## 2018-09-08 DIAGNOSIS — R52 Pain, unspecified: Secondary | ICD-10-CM

## 2018-09-08 DIAGNOSIS — R1032 Left lower quadrant pain: Secondary | ICD-10-CM | POA: Insufficient documentation

## 2018-09-08 DIAGNOSIS — D571 Sickle-cell disease without crisis: Secondary | ICD-10-CM | POA: Insufficient documentation

## 2018-09-08 DIAGNOSIS — R1030 Lower abdominal pain, unspecified: Secondary | ICD-10-CM

## 2018-09-08 DIAGNOSIS — Z3202 Encounter for pregnancy test, result negative: Secondary | ICD-10-CM | POA: Insufficient documentation

## 2018-09-08 LAB — CBC WITH DIFFERENTIAL/PLATELET
Abs Immature Granulocytes: 0.02 10*3/uL (ref 0.00–0.07)
Basophils Absolute: 0 10*3/uL (ref 0.0–0.1)
Basophils Relative: 1 %
Eosinophils Absolute: 0 10*3/uL (ref 0.0–0.5)
Eosinophils Relative: 1 %
HCT: 41.6 % (ref 36.0–46.0)
Hemoglobin: 13.8 g/dL (ref 12.0–15.0)
Immature Granulocytes: 0 %
Lymphocytes Relative: 30 %
Lymphs Abs: 1.6 10*3/uL (ref 0.7–4.0)
MCH: 26.4 pg (ref 26.0–34.0)
MCHC: 33.2 g/dL (ref 30.0–36.0)
MCV: 79.5 fL — ABNORMAL LOW (ref 80.0–100.0)
Monocytes Absolute: 0.4 10*3/uL (ref 0.1–1.0)
Monocytes Relative: 7 %
Neutro Abs: 3.3 10*3/uL (ref 1.7–7.7)
Neutrophils Relative %: 61 %
Platelets: 198 10*3/uL (ref 150–400)
RBC: 5.23 MIL/uL — ABNORMAL HIGH (ref 3.87–5.11)
RDW: 14.5 % (ref 11.5–15.5)
WBC: 5.4 10*3/uL (ref 4.0–10.5)
nRBC: 0 % (ref 0.0–0.2)

## 2018-09-08 LAB — URINALYSIS, ROUTINE W REFLEX MICROSCOPIC
Bilirubin Urine: NEGATIVE
Glucose, UA: NEGATIVE mg/dL
Hgb urine dipstick: NEGATIVE
Ketones, ur: NEGATIVE mg/dL
Nitrite: NEGATIVE
Protein, ur: NEGATIVE mg/dL
Specific Gravity, Urine: 1.012 (ref 1.005–1.030)
pH: 5 (ref 5.0–8.0)

## 2018-09-08 LAB — COMPREHENSIVE METABOLIC PANEL
ALT: 21 U/L (ref 0–44)
AST: 22 U/L (ref 15–41)
Albumin: 4.6 g/dL (ref 3.5–5.0)
Alkaline Phosphatase: 48 U/L (ref 38–126)
Anion gap: 8 (ref 5–15)
BUN: 9 mg/dL (ref 6–20)
CO2: 22 mmol/L (ref 22–32)
Calcium: 9.6 mg/dL (ref 8.9–10.3)
Chloride: 106 mmol/L (ref 98–111)
Creatinine, Ser: 0.86 mg/dL (ref 0.44–1.00)
GFR calc Af Amer: >60 mL/min (ref >60)
GFR calc non Af Amer: >60 mL/min (ref >60)
Glucose, Bld: 93 mg/dL (ref 70–99)
Potassium: 4.1 mmol/L (ref 3.5–5.1)
Sodium: 136 mmol/L (ref 135–145)
Total Bilirubin: 0.8 mg/dL (ref 0.3–1.2)
Total Protein: 8.5 g/dL — ABNORMAL HIGH (ref 6.5–8.1)

## 2018-09-08 LAB — I-STAT BETA HCG BLOOD, ED (MC, WL, AP ONLY): I-stat hCG, quantitative: <5 m[IU]/mL (ref <5)

## 2018-09-08 MED ORDER — SODIUM CHLORIDE 0.9 % IV BOLUS
1000.0000 mL | Freq: Once | INTRAVENOUS | Status: AC
Start: 2018-09-08 — End: 2018-09-08
  Administered 2018-09-08: 1000 mL via INTRAVENOUS

## 2018-09-08 NOTE — ED Notes (Signed)
Pt at RN desk informing RN that she has to be at work at 2 pm and she is leaving. Pt refused to wait for RN to inform PA or see about discharge papers being ready.

## 2018-09-08 NOTE — ED Provider Notes (Signed)
[facility] DEPT Provider Note   CSN: [id number] Arrival date & time: 09/08/18  3154     History   Chief Complaint Chief Complaint  Patient presents with  . Abdominal Pain  . Bloated  . Back Pain    HPI Paige Mack is a 28 y.o. female.     HPI Patient presents to the emergency department with lower abdominal discomfort and she states she is late for her period.  Patient states that she is due to start her period on the 20th.  She states she said tenderness in her breast and soreness in her back as well.  The patient states that she had 2- pregnancy tests at home.  Patient states that nothing seems make her condition better or worse.  The patient denies chest pain, shortness of breath, headache,blurred vision, neck pain, fever, cough, weakness, numbness, dizziness, anorexia, edema, , nausea, vomiting, diarrhea, rash, back pain, dysuria, hematemesis, bloody stool, near syncope, or syncope. Past Medical History:  Diagnosis Date  . Sickle cell anemia (HCC)     There are no active problems to display for this patient.   History reviewed. No pertinent surgical history.   OB History   No obstetric history on file.      Home Medications    Prior to Admission medications   Medication Sig Start Date End Date Taking? Authorizing Provider  hydrOXYzine (ATARAX/VISTARIL) 25 MG tablet Take 1 tablet (25 mg total) by mouth every 6 (six) hours as needed for itching. Patient not taking: Reported on 03/21/2018 09/10/16   Long, Wonda Olds, MD  ibuprofen (ADVIL,MOTRIN) 800 MG tablet Take 1 tablet (800 mg total) by mouth 3 (three) times daily. Patient not taking: Reported on 03/21/2018 03/11/13   Teressa Lower, MD  ondansetron (ZOFRAN) 4 MG tablet Take 1 tablet (4 mg total) by mouth every 8 (eight) hours as needed for nausea or vomiting. Patient not taking: Reported on 03/21/2018 01/27/15   Charlann Lange, PA-C  oseltamivir (TAMIFLU) 75 MG capsule Take 1 capsule (75 mg  total) by mouth every 12 (twelve) hours. Patient not taking: Reported on 03/21/2018 03/11/13   Teressa Lower, MD  permethrin Nancy Fetter) 5 % cream Apply to affected area once Patient not taking: Reported on 03/21/2018 09/10/16   Long, Wonda Olds, MD    Family History No family history on file.  Social History Social History   Tobacco Use  . Smoking status: Never Smoker  . Smokeless tobacco: Never Used  Substance Use Topics  . Alcohol use: Yes    Comment: socially  . Drug use: No     Allergies   Patient has no known allergies.   Review of Systems Review of Systems  All other systems negative except as documented in the HPI. All pertinent positives and negatives as reviewed in the HPI. Physical Exam Updated Vital Signs BP 129/86   Pulse 74   Temp 98.3 F (36.8 C) (Oral)   Resp 18   LMP 08/04/2018   SpO2 100%   Physical Exam Vitals signs and nursing note reviewed.  Constitutional:      General: She is not in acute distress.    Appearance: She is well-developed.  HENT:     Head: Normocephalic and atraumatic.  Eyes:     Pupils: Pupils are equal, round, and reactive to light.  Neck:     Musculoskeletal: Normal range of motion and neck supple.  Cardiovascular:     Rate and Rhythm: Normal rate and regular rhythm.  Heart sounds: Normal heart sounds. No murmur. No friction rub. No gallop.   Pulmonary:     Effort: Pulmonary effort is normal. No respiratory distress.     Breath sounds: Normal breath sounds. No wheezing.  Abdominal:     General: Bowel sounds are normal. There is no distension.     Palpations: Abdomen is soft.     Tenderness: There is abdominal tenderness in the suprapubic area and left lower quadrant.  Genitourinary:    Comments: Patient left the ER before were able to do a pelvic exam. Skin:    General: Skin is warm and dry.     Findings: No erythema or rash.  Neurological:     Mental Status: She is alert and oriented to person, place, and time.      Motor: No abnormal muscle tone.     Coordination: Coordination normal.  Psychiatric:        Behavior: Behavior normal.      ED Treatments / Results  Labs (all labs ordered are listed, but only abnormal results are displayed) Labs Reviewed  COMPREHENSIVE METABOLIC PANEL - Abnormal; Notable for the following components:      Result Value   Total Protein 8.5 (*)    All other components within normal limits  CBC WITH DIFFERENTIAL/PLATELET - Abnormal; Notable for the following components:   RBC 5.23 (*)    MCV 79.5 (*)    All other components within normal limits  URINALYSIS, ROUTINE W REFLEX MICROSCOPIC - Abnormal; Notable for the following components:   APPearance CLOUDY (*)    Leukocytes,Ua SMALL (*)    Bacteria, UA RARE (*)    All other components within normal limits  I-STAT BETA HCG BLOOD, ED ([facility], [facility], [facility] ONLY)    EKG None  Radiology No results found.  Procedures Procedures (including critical care time)  Medications Ordered in ED Medications  sodium chloride 0.9 % bolus 1,000 mL (0 mLs Intravenous Stopped 09/08/18 1231)     Initial Impression / Assessment and Plan / ED Course  I have reviewed the triage vital signs and the nursing notes.  Pertinent labs & imaging results that were available during my care of the patient were reviewed by me and considered in my medical decision making (see chart for details).    Patient will be referred to GYN for further evaluation and care.  The patient has been stable here in the emergency department.  I have advised the patient of the results and all questions were answered.  Patient refuses any further examination or testing at this time.    Received a message from the nurse the patient had ripped out her IV and taken off her monitor leads.  I went spoke with the patient and advised her we will waiting on her ultrasound results.  Patient states she needs to be at work by 2:00.  At 1:25 PM we still do not have the results of the  ultrasound.   Patient left without her paperwork and referral.  Patient also left before we are able to do pelvic exam on the patient.  Final Clinical Impressions(s) / ED Diagnoses   Final diagnoses:  Pain    ED Discharge Orders    None       Charlestine NightLawyer, Shyana Kulakowski, PA-C 09/09/18 1526    Cathren LaineSteinl, Kevin, MD 09/12/18 1312

## 2018-09-08 NOTE — ED Notes (Signed)
Pt requesting discharge, Harrell Gave, [place] made pt aware waiting for [place] results.

## 2018-09-08 NOTE — ED Triage Notes (Signed)
Pt states she is late on her period. Pt feels bloated, abdominal pain, back pain, soreness in feet, tender breasts for the past 10 days. Pt initially thought symptoms were due to her upcoming period, which did not come. Pt had negative pregnancy test but would like blood test to determine if she is pregnant

## 2018-09-08 NOTE — ED Notes (Signed)
Pt removed IV and VS monitoring cords and stated she is ready for discharge. Pt requesting provider.

## 2020-02-18 ENCOUNTER — Emergency Department (HOSPITAL_BASED_OUTPATIENT_CLINIC_OR_DEPARTMENT_OTHER)
Admission: EM | Admit: 2020-02-18 | Discharge: 2020-02-18 | Disposition: A | Discharge status: Home / Self Care | Payer: 59 | Attending: Emergency Medicine | Admitting: Emergency Medicine

## 2020-02-18 ENCOUNTER — Other Ambulatory Visit: Payer: Self-pay

## 2020-02-18 ENCOUNTER — Encounter (HOSPITAL_BASED_OUTPATIENT_CLINIC_OR_DEPARTMENT_OTHER): Payer: Self-pay | Admitting: Emergency Medicine

## 2020-02-18 ENCOUNTER — Emergency Department (HOSPITAL_BASED_OUTPATIENT_CLINIC_OR_DEPARTMENT_OTHER): Payer: 59

## 2020-02-18 DIAGNOSIS — Z79899 Other long term (current) drug therapy: Secondary | ICD-10-CM | POA: Insufficient documentation

## 2020-02-18 DIAGNOSIS — M5442 Lumbago with sciatica, left side: Secondary | ICD-10-CM | POA: Insufficient documentation

## 2020-02-18 DIAGNOSIS — M549 Dorsalgia, unspecified: Secondary | ICD-10-CM

## 2020-02-18 DIAGNOSIS — R109 Unspecified abdominal pain: Secondary | ICD-10-CM | POA: Diagnosis present

## 2020-02-18 LAB — COMPREHENSIVE METABOLIC PANEL
ALT: 14 U/L (ref 0–44)
AST: 20 U/L (ref 15–41)
Albumin: 4.5 g/dL (ref 3.5–5.0)
Alkaline Phosphatase: 39 U/L (ref 38–126)
Anion gap: 8 (ref 5–15)
BUN: 10 mg/dL (ref 6–20)
CO2: 23 mmol/L (ref 22–32)
Calcium: 9.2 mg/dL (ref 8.9–10.3)
Chloride: 103 mmol/L (ref 98–111)
Creatinine, Ser: 0.84 mg/dL (ref 0.44–1.00)
GFR, Estimated: >60 mL/min (ref >60)
Glucose, Bld: 91 mg/dL (ref 70–99)
Potassium: 3.4 mmol/L — ABNORMAL LOW (ref 3.5–5.1)
Sodium: 134 mmol/L — ABNORMAL LOW (ref 135–145)
Total Bilirubin: 0.9 mg/dL (ref 0.3–1.2)
Total Protein: 7.8 g/dL (ref 6.5–8.1)

## 2020-02-18 LAB — LIPASE, BLOOD: Lipase: 24 U/L (ref 11–51)

## 2020-02-18 LAB — URINALYSIS, ROUTINE W REFLEX MICROSCOPIC
Bilirubin Urine: NEGATIVE
Glucose, UA: NEGATIVE mg/dL
Hgb urine dipstick: NEGATIVE
Ketones, ur: NEGATIVE mg/dL
Leukocytes,Ua: NEGATIVE
Nitrite: NEGATIVE
Protein, ur: NEGATIVE mg/dL
Specific Gravity, Urine: 1.005 (ref 1.005–1.030)
pH: 6 (ref 5.0–8.0)

## 2020-02-18 LAB — CBC
HCT: 36 % (ref 36.0–46.0)
Hemoglobin: 12.6 g/dL (ref 12.0–15.0)
MCH: 27.5 pg (ref 26.0–34.0)
MCHC: 35 g/dL (ref 30.0–36.0)
MCV: 78.4 fL — ABNORMAL LOW (ref 80.0–100.0)
Platelets: 228 10*3/uL (ref 150–400)
RBC: 4.59 MIL/uL (ref 3.87–5.11)
RDW: 14.5 % (ref 11.5–15.5)
WBC: 6.6 10*3/uL (ref 4.0–10.5)
nRBC: 0 % (ref 0.0–0.2)

## 2020-02-18 LAB — PREGNANCY, URINE: Preg Test, Ur: NEGATIVE

## 2020-02-18 MED ORDER — CYCLOBENZAPRINE HCL 10 MG PO TABS: 10.0000 mg | ORAL_TABLET | Freq: Three times a day (TID) | ORAL | 0 refills | Status: DC | PRN

## 2020-02-18 MED ORDER — OXYCODONE-ACETAMINOPHEN 5-325 MG PO TABS
1.0000 | ORAL_TABLET | Freq: Once | ORAL | Status: AC
  Administered 2020-02-18: 1 via ORAL
  Filled 2020-02-18: qty 1

## 2020-02-18 MED ORDER — KETOROLAC TROMETHAMINE 60 MG/2ML IM SOLN
30.0000 mg | Freq: Once | INTRAMUSCULAR | Status: AC
  Administered 2020-02-18: 30 mg via INTRAMUSCULAR
  Filled 2020-02-18: qty 2

## 2020-02-18 NOTE — Discharge Instructions (Addendum)
Take Tylenol and Motrin as needed for pain.  Take muscle relaxer as needed.  Return to ER for reassessment if you develop worsening abdominal pain, vomiting or other new concerning symptom.  The radiologist commented on an incidental finding and recommended that you obtain an MRI of your right femur to further characterize. Concern for possibly a nonossifying fibroma or a chondromyxoid fibroma.

## 2020-02-18 NOTE — ED Triage Notes (Signed)
Pt arrives pov with LLQ pain radiating to left lower back since this morning. Pt endorses constipation x 2 days and urinary frequency.

## 2020-02-19 NOTE — ED Provider Notes (Signed)
[facility] EMERGENCY DEPARTMENT Provider Note   CSN: [id number] Arrival date & time: 02/18/20  1840     History Chief Complaint  Patient presents with  . Abdominal Pain    Paige Mack is a 30 y.o. female.  Presents here with concern for back pain.  States that over the past couple days she has had left lower back pain, today it seemed to move more to her lower abdomen.  Denies pelvic pain.  No vaginal bleeding or vaginal discharge.  Also thinks that she has had some increased urinary frequency.  No nausea vomiting or diarrhea.  Pain is constant, seems to be worsened with certain positions.  No known injuries.  Denies any medical problems, no past surgical history.  Pain is currently mild to moderate.  HPI     Past Medical History:  Diagnosis Date  . Sickle cell anemia (HCC)     There are no problems to display for this patient.   History reviewed. No pertinent surgical history.   OB History   No obstetric history on file.     History reviewed. No pertinent family history.  Social History   Tobacco Use  . Smoking status: Never Smoker  . Smokeless tobacco: Never Used  Substance Use Topics  . Alcohol use: Yes    Comment: socially  . Drug use: Yes    Types: Marijuana    Home Medications Prior to Admission medications   Medication Sig Start Date End Date Taking? Authorizing Provider  cyclobenzaprine (FLEXERIL) 10 MG tablet Take 1 tablet (10 mg total) by mouth 3 (three) times daily as needed for muscle spasms. 02/18/20  Yes Milagros Loll, MD  hydrOXYzine (ATARAX/VISTARIL) 25 MG tablet Take 1 tablet (25 mg total) by mouth every 6 (six) hours as needed for itching. Patient not taking: Reported on 03/21/2018 09/10/16   Long, Arlyss Repress, MD  ibuprofen (ADVIL,MOTRIN) 800 MG tablet Take 1 tablet (800 mg total) by mouth 3 (three) times daily. Patient not taking: Reported on 03/21/2018 03/11/13   Sunnie Nielsen, MD  ondansetron (ZOFRAN) 4 MG tablet Take 1 tablet (4 mg  total) by mouth every 8 (eight) hours as needed for nausea or vomiting. Patient not taking: Reported on 03/21/2018 01/27/15   Elpidio Anis, PA-C  oseltamivir (TAMIFLU) 75 MG capsule Take 1 capsule (75 mg total) by mouth every 12 (twelve) hours. Patient not taking: Reported on 03/21/2018 03/11/13   Sunnie Nielsen, MD  permethrin Verner Mould) 5 % cream Apply to affected area once Patient not taking: Reported on 03/21/2018 09/10/16   Long, Arlyss Repress, MD    Allergies    Patient has no known allergies.  Review of Systems   Review of Systems  Constitutional: Negative for chills and fever.  HENT: Negative for ear pain and sore throat.   Eyes: Negative for pain and visual disturbance.  Respiratory: Negative for cough and shortness of breath.   Cardiovascular: Negative for chest pain and palpitations.  Gastrointestinal: Positive for abdominal pain. Negative for vomiting.  Genitourinary: Negative for dysuria and hematuria.  Musculoskeletal: Positive for back pain. Negative for arthralgias.  Skin: Negative for color change and rash.  Neurological: Negative for seizures and syncope.  All other systems reviewed and are negative.   Physical Exam Updated Vital Signs BP (!) 170/97 (BP Location: Right Arm)   Pulse 73   Temp 98.7 F (37.1 C) (Oral)   Resp 14   Ht 5\' 3"  (1.6 m)   Wt 74.8 kg  LMP 01/24/2020   SpO2 100%   BMI 29.23 kg/m   Physical Exam Vitals and nursing note reviewed.  Constitutional:      General: She is not in acute distress.    Appearance: She is well-developed and well-nourished.  HENT:     Head: Normocephalic and atraumatic.  Eyes:     Conjunctiva/sclera: Conjunctivae normal.  Cardiovascular:     Rate and Rhythm: Normal rate and regular rhythm.     Heart sounds: No murmur heard.   Pulmonary:     Effort: Pulmonary effort is normal. No respiratory distress.     Breath sounds: Normal breath sounds.  Abdominal:     Palpations: Abdomen is soft.     Tenderness: There is no  abdominal tenderness.     Comments: Some tenderness noted in left flank, left lower back, no CVA tenderness, there is no tenderness throughout her entire abdomen.  Musculoskeletal:        General: No edema.     Cervical back: Neck supple.  Skin:    General: Skin is warm and dry.  Neurological:     Mental Status: She is alert.  Psychiatric:        Mood and Affect: Mood and affect normal.     ED Results / Procedures / Treatments   Labs (all labs ordered are listed, but only abnormal results are displayed) Labs Reviewed  COMPREHENSIVE METABOLIC PANEL - Abnormal; Notable for the following components:      Result Value   Sodium 134 (*)    Potassium 3.4 (*)    All other components within normal limits  CBC - Abnormal; Notable for the following components:   MCV 78.4 (*)    All other components within normal limits  LIPASE, BLOOD  URINALYSIS, ROUTINE W REFLEX MICROSCOPIC  PREGNANCY, URINE    EKG None  Radiology CT Renal Stone Study  Result Date: 02/18/2020 CLINICAL DATA:  30 year old female with left flank pain. EXAM: CT ABDOMEN AND PELVIS WITHOUT CONTRAST TECHNIQUE: Multidetector CT imaging of the abdomen and pelvis was performed following the standard protocol without IV contrast. COMPARISON:  None. FINDINGS: Evaluation of this exam is limited in the absence of intravenous contrast. Lower chest: The visualized lung bases are clear. No intra-abdominal free air. Small free fluid in the pelvis. Hepatobiliary: The liver is unremarkable. No intrahepatic biliary dilatation. The gallbladder is unremarkable Pancreas: Unremarkable. No pancreatic ductal dilatation or surrounding inflammatory changes. Spleen: Normal in size without focal abnormality. Adrenals/Urinary Tract: The adrenal glands unremarkable the kidneys, visualized ureters, and urinary bladder appear unremarkable. Stomach/Bowel: There is no bowel obstruction or active inflammation. The appendix is not visualized with certainty. No  inflammatory changes identified in the right lower quadrant. Vascular/Lymphatic: The abdominal aorta and IVC unremarkable no portal venous gas. There is no adenopathy. Reproductive: The uterus is anteverted and grossly unremarkable. Mildly enlarged appearance of the right ovary, likely related to dominant follicle. Other: None Musculoskeletal: No acute osseous pathology. There is a 4.5 x 2.6 cm osseous lesion with mixed lucent and sclerotic matrix and sharp transition eccentrically located in the proximal metaphysis of the right femur. No cortical breakage or soft tissue component. This is not characterized but may represent a nonossifying fibroma or a chondromyxoid fibroma. Further characterization with MRI on a nonemergent basis recommended. IMPRESSION: 1. No acute intra-abdominal or pelvic pathology. No hydronephrosis or nephrolithiasis. 2. Mixed osseous lesion in the proximal right femur with no aggressive features. Further characterization with MRI is recommended. Electronically Signed  By: Anner Crete M.D.   On: 02/18/2020 22:35    Procedures Procedures (including critical care time)  Medications Ordered in ED Medications  ketorolac (TORADOL) injection 30 mg (30 mg Intramuscular Given 02/18/20 2227)  oxyCODONE-acetaminophen (PERCOCET/ROXICET) 5-325 MG per tablet 1 tablet (1 tablet Oral Given 02/18/20 2228)    ED Course  I have reviewed the triage vital signs and the nursing notes.  Pertinent labs & imaging results that were available during my care of the patient were reviewed by me and considered in my medical decision making (see chart for details).    MDM Rules/Calculators/A&P                         30 year old lady presenting to ER with concern for back pain radiating into her left lower quadrant.  On exam, patient noted to be remarkably well-appearing in no distress.  All of her blood work and urinalysis was reassuring, no evidence for UTI.  Concern for either stone or possibly MSK  etiology.  CT ordered to further evaluate.  Negative for acute intra-abdominal or pelvic pathology.  Radiologist commented on osseous lesion in proximal right femur and recommended nonemergent MRI.  On reassessment, patient symptoms had resolved and she remained well-appearing.  I discussed the incidental finding and need for outpatient MRI.  Patient demonstrated understanding of follow-up and work-up precautions.   After the discussed management above, the patient was determined to be safe for discharge.  The patient was in agreement with this plan and all questions regarding their care were answered.  ED return precautions were discussed and the patient will return to the ED with any significant worsening of condition.  Final Clinical Impression(s) / ED Diagnoses Final diagnoses:  Acute left-sided back pain, unspecified back location    Rx / DC Orders ED Discharge Orders         Ordered    cyclobenzaprine (FLEXERIL) 10 MG tablet  3 times daily PRN        02/18/20 2316           Lucrezia Starch, MD 02/19/20 1115

## 2020-02-24 ENCOUNTER — Other Ambulatory Visit: Payer: Self-pay

## 2020-02-24 ENCOUNTER — Encounter: Payer: Self-pay | Admitting: Family

## 2020-02-24 ENCOUNTER — Ambulatory Visit (INDEPENDENT_AMBULATORY_CARE_PROVIDER_SITE_OTHER): Payer: 59 | Admitting: Family

## 2020-02-24 VITALS — BP 132/92 | HR 84 | Ht 64.45 in | Wt 164.4 lb

## 2020-02-24 DIAGNOSIS — M899 Disorder of bone, unspecified: Secondary | ICD-10-CM

## 2020-02-24 DIAGNOSIS — M545 Low back pain, unspecified: Secondary | ICD-10-CM

## 2020-02-24 DIAGNOSIS — Z7689 Persons encountering health services in other specified circumstances: Secondary | ICD-10-CM

## 2020-02-24 DIAGNOSIS — Z23 Encounter for immunization: Secondary | ICD-10-CM | POA: Diagnosis not present

## 2020-02-24 NOTE — Patient Instructions (Addendum)
Return in 4 to 6 weeks or sooner if needed for annual physical examination, labs, and health maintenance. Arrive fasting meaning having had no food and/or nothing to drink for at least 8 hours prior to appointment.  MRI of right femur at the [facility] Nurse will give patient appointment details.  Thank you for choosing Primary Care at [facility] for your medical home!    Paige Mack was seen by Rema Fendt, NP today.   Paige Mack's primary care provider is Yehudah Standing Jodi Geralds, NP.   For the best care possible,  you should try to see Ricky Stabs, NP whenever you come to clinic.   We look forward to seeing you again soon!  If you have any questions about your visit today,  please call us at [phone]  Or feel free to reach your provider via MyChart.     Acute Back Pain, Adult Acute back pain is sudden and usually short-lived. It is often caused by an injury to the muscles and tissues in the back. The injury may result from:  A muscle or ligament getting overstretched or torn (strained). Ligaments are tissues that connect bones to each other. Lifting something improperly can cause a back strain.  Wear and tear (degeneration) of the spinal disks. Spinal disks are circular tissue that provide cushioning between the bones of the spine (vertebrae).  Twisting motions, such as while playing sports or doing yard work.  A hit to the back.  Arthritis. You may have a physical exam, lab tests, and imaging tests to find the cause of your pain. Acute back pain usually goes away with rest and home care. Follow these instructions at home: Managing pain, stiffness, and swelling  Treatment may include medicines for pain and inflammation that are taken by mouth or applied to the skin, prescription pain medicine, or muscle relaxants. Take over-the-counter and prescription medicines only as told by your health care provider.  Your health care provider may recommend applying ice  during the first 24-48 hours after your pain starts. To do this: ? Put ice in a plastic bag. ? Place a towel between your skin and the bag. ? Leave the ice on for 20 minutes, 2-3 times a day.  If directed, apply heat to the affected area as often as told by your health care provider. Use the heat source that your health care provider recommends, such as a moist heat pack or a heating pad. ? Place a towel between your skin and the heat source. ? Leave the heat on for 20-30 minutes. ? Remove the heat if your skin turns bright red. This is especially important if you are unable to feel pain, heat, or cold. You have a greater risk of getting burned. Activity  Do not stay in bed. Staying in bed for more than 1-2 days can delay your recovery.  Sit up and stand up straight. Avoid leaning forward when you sit or hunching over when you stand. ? If you work at a desk, sit close to it so you do not need to lean over. Keep your chin tucked in. Keep your neck drawn back, and keep your elbows bent at a 90-degree angle (right angle). ? Sit high and close to the steering wheel when you drive. Add lower back (lumbar) support to your car seat, if needed.  Take short walks on even surfaces as soon as you are able. Try to increase the length of time you walk each day.  Do not sit, drive, or stand in one place for more than 30 minutes at a time. Sitting or standing for long periods of time can put stress on your back.  Do not drive or use heavy machinery while taking prescription pain medicine.  Use proper lifting techniques. When you bend and lift, use positions that put less stress on your back: ? Perry your knees. ? Keep the load close to your body. ? Avoid twisting.  Exercise regularly as told by your health care provider. Exercising helps your back heal faster and helps prevent back injuries by keeping muscles strong and flexible.  Work with a physical therapist to make a safe exercise program, as  recommended by your health care provider. Do any exercises as told by your physical therapist.   Lifestyle  Maintain a healthy weight. Extra weight puts stress on your back and makes it difficult to have good posture.  Avoid activities or situations that make you feel anxious or stressed. Stress and anxiety increase muscle tension and can make back pain worse. Learn ways to manage anxiety and stress, such as through exercise. General instructions  Sleep on a firm mattress in a comfortable position. Try lying on your side with your knees slightly bent. If you lie on your back, put a pillow under your knees.  Follow your treatment plan as told by your health care provider. This may include: ? Cognitive or behavioral therapy. ? Acupuncture or massage therapy. ? Meditation or yoga. Contact a health care provider if:  You have pain that is not relieved with rest or medicine.  You have increasing pain going down into your legs or buttocks.  Your pain does not improve after 2 weeks.  You have pain at night.  You lose weight without trying.  You have a fever or chills. Get help right away if:  You develop new bowel or bladder control problems.  You have unusual weakness or numbness in your arms or legs.  You develop nausea or vomiting.  You develop abdominal pain.  You feel faint. Summary  Acute back pain is sudden and usually short-lived.  Use proper lifting techniques. When you bend and lift, use positions that put less stress on your back.  Take over-the-counter and prescription medicines and apply heat or ice as directed by your health care provider. This information is not intended to replace advice given to you by your health care provider. Make sure you discuss any questions you have with your health care provider. Document Revised: 10/25/2019 Document Reviewed: 10/25/2019 Elsevier Patient Education  2021 [employer].

## 2020-02-24 NOTE — Progress Notes (Signed)
Establish care Wants flu/tetanus vacc

## 2020-02-24 NOTE — Progress Notes (Signed)
Subjective:    Paige Mack - 30 y.o. female MRN [id number]  Date of birth: [birth year]  HPI  Paige Mack is to establish care. Patient has no signigicant PMH.   Current issues and/or concerns: 1. BACK PAIN: 02/18/2020: Visit at [facility] with acute left-sided back pain. All blood work and urinalysis reassuring, no evidence of UTI. Concern for either stone or possibly MSK etiology. CT negative for acute intra-abdominal or pelvic pathology. Radiologist commented on osseous lesion in proximal right femur and recommended non-emergent MRI. Follow-up with PCP.  02/24/2020: Today reports still having lower back pain 6/10 radiating to stomach. Taking muscle relaxants and not helping much.  Radiation: stomach Better with: ice, laying on back  Trauma: denies Popping/clicking sounds with movement/bending: denies Muscle spasms: yes   Health Maintenance:  Health Maintenance Due  Topic Date Due  . Hepatitis C Screening  Never done  . PAP-Cervical Cytology Screening  Never done  . PAP SMEAR-Modifier  Never done  . COVID-19 Vaccine (2 - Pfizer 2-dose series) 10/08/2019   Past Medical History: There are no problems to display for this patient.  Social History   reports that she is a non-smoker but has been exposed to tobacco smoke. She has never used smokeless tobacco. She reports previous alcohol use. She reports current drug use. Drug: Marijuana.   Family History  family history includes Healthy in her father; Hypertension in her mother.   Medications: reviewed and updated   Objective:   Physical Exam BP (!) 132/92 (BP Location: Left Arm, Patient Position: Sitting)   Pulse 84   Ht 5' 4.45" (1.637 m)   Wt 164 lb 6.4 oz (74.6 kg)   LMP 02/23/2020   SpO2 98%   BMI 27.83 kg/m  Physical Exam HENT:     Head: Normocephalic.  Eyes:     Extraocular Movements: Extraocular movements intact.     Pupils: Pupils are equal, round, and reactive to light.   Cardiovascular:     Rate and Rhythm: Normal rate and regular rhythm.     Pulses: Normal pulses.     Heart sounds: Normal heart sounds.  Pulmonary:     Effort: Pulmonary effort is normal.     Breath sounds: Normal breath sounds.  Abdominal:     General: Bowel sounds are normal.     Palpations: Abdomen is soft.     Tenderness: There is abdominal tenderness.     Comments: Abdominal tenderness epigastric region and left lower quadrant  Musculoskeletal:     Cervical back: Normal range of motion and neck supple.  Neurological:     General: No focal deficit present.     Mental Status: She is alert and oriented to person, place, and time.  Psychiatric:        Mood and Affect: Mood normal.        Behavior: Behavior normal.         Assessment & Plan:  1. Encounter to establish care: - Patient presents today to establish care.  - Return in 4 to 6 weeks or sooner if needed for annual physical examination, labs, and health maintenance. Arrive fasting meaning having had no food and/or nothing to drink for at least 8 hours prior to appointment.  2. Acute bilateral low back pain, unspecified whether sciatica present: - Visit on 02/18/2020 at [facility] with acute left-sided back pain. All blood work and urinalysis reassuring, no evidence of UTI. Concern for either stone or possibly MSK  etiology. CT negative for acute intra-abdominal or pelvic pathology. Radiologist commented on osseous lesion in proximal right femur and recommended non-emergent MRI. Follow-up with PCP. - Today reports back and stomach pain about the same since last week.  - Continue Cyclobenzaprine as prescribed. May cause drowsiness. Counseled patient to not consume if operating heavy machinery or driving. Counseled patient to not consume with alcohol.  3. Lesion of right femur: - Incidental finding of mixed osseous lesion of right femur on 02/18/2020. - MRI of right femur indicated. Appointment  scheduled on 03/05/2020 at [facility]. - MR [facility] RIGHT WO CONTRAST; Future  4. Need for immunization against influenza: - Flu vaccine administered today in clinic. - Flu Vaccine QUAD 36+ mos IM  Ricky Stabs, NP 02/24/2020, 12:47 PM Primary Care at [facility]

## 2020-03-03 ENCOUNTER — Telehealth: Payer: Self-pay | Admitting: Family

## 2020-03-03 NOTE — Telephone Encounter (Signed)
[name] from the pre service center called saying that the patient needs a prior authorization for the MRI.

## 2020-03-05 ENCOUNTER — Ambulatory Visit (HOSPITAL_COMMUNITY): Admission: RE | Admit: 2020-03-05 | Payer: 59 | Source: Ambulatory Visit

## 2020-03-12 NOTE — Telephone Encounter (Signed)
Spoke to representative today at pt insurance plan. ([employer]). PA request has been faxed to plan. Pending authorization

## 2020-03-13 ENCOUNTER — Telehealth: Payer: Self-pay

## 2020-03-13 NOTE — Telephone Encounter (Signed)
Pt wants to know what's go on with the PA for the MRI

## 2020-03-17 ENCOUNTER — Ambulatory Visit (HOSPITAL_COMMUNITY): Payer: 59

## 2020-03-28 ENCOUNTER — Ambulatory Visit (HOSPITAL_COMMUNITY): Payer: 59

## 2020-03-30 ENCOUNTER — Telehealth: Payer: Self-pay

## 2020-03-30 NOTE — Telephone Encounter (Signed)
Att to contact pt to advise that MRI procedure was approved by her insurance Pt no show to 2/12 appt for procedure MRI approved since 03/12/20

## 2020-04-04 ENCOUNTER — Ambulatory Visit (HOSPITAL_COMMUNITY): Admission: RE | Admit: 2020-04-04 | Payer: 59 | Source: Ambulatory Visit

## 2020-04-06 ENCOUNTER — Ambulatory Visit (INDEPENDENT_AMBULATORY_CARE_PROVIDER_SITE_OTHER): Payer: 59 | Admitting: Family

## 2020-04-06 ENCOUNTER — Other Ambulatory Visit: Payer: Self-pay

## 2020-04-06 VITALS — BP 129/87 | HR 64 | Wt 167.4 lb

## 2020-04-06 DIAGNOSIS — Z1329 Encounter for screening for other suspected endocrine disorder: Secondary | ICD-10-CM

## 2020-04-06 DIAGNOSIS — Z13220 Encounter for screening for lipoid disorders: Secondary | ICD-10-CM | POA: Diagnosis not present

## 2020-04-06 DIAGNOSIS — M899 Disorder of bone, unspecified: Secondary | ICD-10-CM

## 2020-04-06 DIAGNOSIS — Z0000 Encounter for general adult medical examination without abnormal findings: Secondary | ICD-10-CM

## 2020-04-06 DIAGNOSIS — Z131 Encounter for screening for diabetes mellitus: Secondary | ICD-10-CM

## 2020-04-06 DIAGNOSIS — L853 Xerosis cutis: Secondary | ICD-10-CM

## 2020-04-06 DIAGNOSIS — Z13228 Encounter for screening for other metabolic disorders: Secondary | ICD-10-CM | POA: Diagnosis not present

## 2020-04-06 DIAGNOSIS — Z1159 Encounter for screening for other viral diseases: Secondary | ICD-10-CM

## 2020-04-06 DIAGNOSIS — E559 Vitamin D deficiency, unspecified: Secondary | ICD-10-CM

## 2020-04-06 DIAGNOSIS — Z130 Encounter for screening for diseases of the blood and blood-forming organs and certain disorders involving the immune mechanism: Secondary | ICD-10-CM

## 2020-04-06 DIAGNOSIS — Z124 Encounter for screening for malignant neoplasm of cervix: Secondary | ICD-10-CM

## 2020-04-06 DIAGNOSIS — Z1321 Encounter for screening for nutritional disorder: Secondary | ICD-10-CM

## 2020-04-06 NOTE — Patient Instructions (Addendum)
Annual physical exam and labs today. Will call with results.   Referral to Obstetrics/Gynecology for PAP smear. Expect call with appointment details within 2 weeks.  Follow-up with primary provider as needed.   Preventive Care 81-29 Years Old, Female Preventive care refers to lifestyle choices and visits with your health care provider that can promote health and wellness. This includes:  A yearly physical exam. This is also called an annual wellness visit.  Regular dental and eye exams.  Immunizations.  Screening for certain conditions.  Healthy lifestyle choices, such as: ? Eating a healthy diet. ? Getting regular exercise. ? Not using drugs or products that contain nicotine and tobacco. ? Limiting alcohol use. What can I expect for my preventive care visit? Physical exam Your health care provider may check your:  Height and weight. These may be used to calculate your BMI (body mass index). BMI is a measurement that tells if you are at a healthy weight.  Heart rate and blood pressure.  Body temperature.  Skin for abnormal spots. Counseling Your health care provider may ask you questions about your:  Past medical problems.  Family's medical history.  Alcohol, tobacco, and drug use.  Emotional well-being.  Home life and relationship well-being.  Sexual activity.  Diet, exercise, and sleep habits.  Work and work Statistician.  Access to firearms.  Method of birth control.  Menstrual cycle.  Pregnancy history. What immunizations do I need? Vaccines are usually given at various ages, according to a schedule. Your health care provider will recommend vaccines for you based on your age, medical history, and lifestyle or other factors, such as travel or where you work.   What tests do I need? Blood tests  Lipid and cholesterol levels. These may be checked every 5 years starting at age 75.  Hepatitis C test.  Hepatitis B test. Screening  Diabetes  screening. This is done by checking your blood sugar (glucose) after you have not eaten for a while (fasting).  STD (sexually transmitted disease) testing, if you are at risk.  BRCA-related cancer screening. This may be done if you have a family history of breast, ovarian, tubal, or peritoneal cancers.  Pelvic exam and Pap test. This may be done every 3 years starting at age 26. Starting at age 76, this may be done every 5 years if you have a Pap test in combination with an HPV test. Talk with your health care provider about your test results, treatment options, and if necessary, the need for more tests.   Follow these instructions at home: Eating and drinking  Eat a healthy diet that includes fresh fruits and vegetables, whole grains, lean protein, and low-fat dairy products.  Take vitamin and mineral supplements as recommended by your health care provider.  Do not drink alcohol if: ? Your health care provider tells you not to drink. ? You are pregnant, may be pregnant, or are planning to become pregnant.  If you drink alcohol: ? Limit how much you have to 0-1 drink a day. ? Be aware of how much alcohol is in your drink. In the [place] one drink equals one 12 oz bottle of beer (355 mL), one 5 oz glass of wine (148 mL), or one 1 oz glass of hard liquor (44 mL).   Lifestyle  Take daily care of your teeth and gums. Brush your teeth every morning and night with fluoride toothpaste. Floss one time each day.  Stay active. Exercise for at least 30 minutes 5  or more days each week.  Do not use any products that contain nicotine or tobacco, such as cigarettes, e-cigarettes, and chewing tobacco. If you need help quitting, ask your health care provider.  Do not use drugs.  If you are sexually active, practice safe sex. Use a condom or other form of protection to prevent STIs (sexually transmitted infections).  If you do not wish to become pregnant, use a form of birth control. If you plan to  become pregnant, see your health care provider for a prepregnancy visit.  Find healthy ways to cope with stress, such as: ? Meditation, yoga, or listening to music. ? Journaling. ? Talking to a trusted person. ? Spending time with friends and family. Safety  Always wear your seat belt while driving or riding in a vehicle.  Do not drive: ? If you have been drinking alcohol. Do not ride with someone who has been drinking. ? When you are tired or distracted. ? While texting.  Wear a helmet and other protective equipment during sports activities.  If you have firearms in your house, make sure you follow all gun safety procedures.  Seek help if you have been physically or sexually abused. What's next?  Go to your health care provider once a year for an annual wellness visit.  Ask your health care provider how often you should have your eyes and teeth checked.  Stay up to date on all vaccines. This information is not intended to replace advice given to you by your health care provider. Make sure you discuss any questions you have with your health care provider. Document Revised: 09/29/2019 Document Reviewed: 10/12/2017 Elsevier Patient Education  2021 [employer].

## 2020-04-06 NOTE — Progress Notes (Signed)
annual physical

## 2020-04-06 NOTE — Progress Notes (Addendum)
Patient ID: Paige Mack, female    DOB: July 18, 1990  MRN: [id number]  CC: Annual Physical Exam  Subjective: Paige Mack is a 30 y.o. female who presents for annual physical exam. Her concerns today include: Requesting to know what her blood type is stating she has never known this information. Reports constant dry skin and feeling fatigued.   No current outpatient medications on file prior to visit.   No current facility-administered medications on file prior to visit.    No Known Allergies  Social History   Socioeconomic History  . Marital status: Single    Spouse name: Not on file  . Number of children: Not on file  . Years of education: Not on file  . Highest education level: Not on file  Occupational History  . Not on file  Tobacco Use  . Smoking status: Passive Smoke Exposure - Never Smoker  . Smokeless tobacco: Never Used  Vaping Use  . Vaping Use: Never used  Substance and Sexual Activity  . Alcohol use: Not Currently    Comment: socially  . Drug use: Yes    Types: Marijuana  . Sexual activity: Not on file  Other Topics Concern  . Not on file  Social History Narrative  . Not on file   Social Determinants of Health   Financial Resource Strain: Not on file  Food Insecurity: Not on file  Transportation Needs: Not on file  Physical Activity: Not on file  Stress: Not on file  Social Connections: Not on file  Intimate Partner Violence: Not on file    Family History  Problem Relation Age of Onset  . Hypertension Mother   . Healthy Father     Past Surgical History:  Procedure Laterality Date  . NO PAST SURGERIES      ROS: Review of Systems Negative except as stated above  PHYSICAL EXAM: BP 129/87 (BP Location: Left Arm, Patient Position: Sitting)   Pulse 64   Wt 167 lb 6.4 oz (75.9 kg)   SpO2 98%   BMI 28.34 kg/m    Wt Readings from Last 3 Encounters:  04/06/20 167 lb 6.4 oz (75.9 kg)  02/24/20 164 lb 6.4 oz (74.6 kg)  02/18/20 165 lb  (74.8 kg)    Physical Exam Exam conducted with a chaperone present.  Constitutional:      Appearance: She is overweight.  HENT:     Head: Normocephalic and atraumatic.     Right Ear: Tympanic membrane, ear canal and external ear normal.     Left Ear: Tympanic membrane, ear canal and external ear normal.     Nose: Nose normal.     Mouth/Throat:     Mouth: Mucous membranes are moist.     Pharynx: Oropharynx is clear.  Eyes:     Extraocular Movements: Extraocular movements intact.     Conjunctiva/sclera: Conjunctivae normal.     Pupils: Pupils are equal, round, and reactive to light.  Cardiovascular:     Rate and Rhythm: Normal rate and regular rhythm.     Pulses: Normal pulses.     Heart sounds: Normal heart sounds.  Pulmonary:     Effort: Pulmonary effort is normal.     Breath sounds: Normal breath sounds.  Chest:  Breasts:     Right: Normal.     Left: Normal.      Comments: Margorie John, CMA present during examination.  Abdominal:     General: Abdomen is flat. Bowel sounds are normal.  Palpations: Abdomen is soft.  Genitourinary:    Comments: Patient declined examination. Musculoskeletal:        General: Normal range of motion.     Cervical back: Normal range of motion and neck supple.  Skin:    General: Skin is warm and dry.     Capillary Refill: Capillary refill takes less than 2 seconds.  Neurological:     General: No focal deficit present.     Mental Status: She is alert and oriented to person, place, and time.  Psychiatric:        Mood and Affect: Mood normal.        Behavior: Behavior normal.        Thought Content: Thought content normal.        Judgment: Judgment normal.    ASSESSMENT AND PLAN: 1. Encounter for annual physical exam: - Counseled on 150 minutes of exercise per week, healthy eating (including decreased daily intake of saturated fats, cholesterol, added sugars, sodium), STI prevention, routine healthcare maintenance. - Per patient  request ABO to determine patient's blood type. - ABO  2. Screening for metabolic disorder: - CMP last obtained 02/18/2020.  3. Diabetes mellitus screening: - Hemoglobin A1c to screen for pre-diabetes/diabetes. - Hemoglobin A1c  4. Screening cholesterol level: - Lipid panel to screen for high cholesterol.  - Lipid Panel  5. Screening, iron deficiency anemia: - CBC last obtained 02/18/2020.  6. Thyroid disorder screen: - TSH to check thyroid function.  - TSH  7. Need for hepatitis C screening test: - HCV antibody to screen for hepatitis C.  - HCV Ab w/Rflx to Verification  8. Cervical cancer screening: - Per patient request referral to Obstetrics / Gynecology for cervical cancer screening by PAP smear.  - Ambulatory referral to Obstetrics / Gynecology  9. Encounter for vitamin deficiency screening: - Vitamin B12 and Vitamin D deficiency screening per patient request. - Vitamin B12 - Vitamin D, 25-hydroxy  10. Dry skin: - Patient reports using over-the-counter home remedy for dry skin which helps some. - Counseled importance of moisturizing skin daily with a mild lotion such as Aveeno, CeraVe, Cetaphil, Eucerin, or Aquaphor. - Counseled importance of using a mild soap such as Dove and a fragrance free laundry detergent. - Follow-up with primary provider as needed.   Patient was given the opportunity to ask questions.  Patient verbalized understanding of the plan and was able to repeat key elements of the plan. Patient was given clear instructions to go to Emergency Department or return to medical center if symptoms don't improve, worsen, or new problems develop.The patient verbalized understanding.   Orders Placed This Encounter  Procedures  . TSH  . Lipid Panel  . Hemoglobin A1c  . HCV Ab w/Rflx to Verification  . Vitamin B12  . Vitamin D, 25-hydroxy  . ABO  . Ambulatory referral to Obstetrics / Gynecology     Requested Prescriptions    No prescriptions requested  or ordered in this encounter    Return for as needed with primary provider.  Rema Fendt, NP

## 2020-04-08 ENCOUNTER — Other Ambulatory Visit: Payer: Self-pay

## 2020-04-08 ENCOUNTER — Other Ambulatory Visit: Payer: 59

## 2020-04-08 DIAGNOSIS — Z13220 Encounter for screening for lipoid disorders: Secondary | ICD-10-CM

## 2020-04-08 DIAGNOSIS — Z1329 Encounter for screening for other suspected endocrine disorder: Secondary | ICD-10-CM

## 2020-04-08 DIAGNOSIS — Z131 Encounter for screening for diabetes mellitus: Secondary | ICD-10-CM

## 2020-04-08 DIAGNOSIS — Z1159 Encounter for screening for other viral diseases: Secondary | ICD-10-CM

## 2020-04-08 DIAGNOSIS — Z1321 Encounter for screening for nutritional disorder: Secondary | ICD-10-CM

## 2020-04-08 DIAGNOSIS — Z0000 Encounter for general adult medical examination without abnormal findings: Secondary | ICD-10-CM

## 2020-04-09 DIAGNOSIS — E559 Vitamin D deficiency, unspecified: Secondary | ICD-10-CM | POA: Insufficient documentation

## 2020-04-09 LAB — HCV AB W/RFLX TO VERIFICATION: HCV Ab: <0.1 s/co ratio (ref 0.0–0.9)

## 2020-04-09 LAB — HEMOGLOBIN A1C
Est. average glucose Bld gHb Est-mCnc: <74 mg/dL
Hgb A1c MFr Bld: <4.2 % — ABNORMAL LOW (ref 4.8–5.6)

## 2020-04-09 LAB — LIPID PANEL
Chol/HDL Ratio: 3.1 ratio (ref 0.0–4.4)
Cholesterol, Total: 162 mg/dL (ref 100–199)
HDL: 53 mg/dL (ref >39)
LDL Chol Calc (NIH): 99 mg/dL (ref 0–99)
Triglycerides: 48 mg/dL (ref 0–149)
VLDL Cholesterol Cal: 10 mg/dL (ref 5–40)

## 2020-04-09 LAB — ABO

## 2020-04-09 LAB — HCV INTERPRETATION

## 2020-04-09 LAB — VITAMIN B12: Vitamin B-12: 608 pg/mL (ref 232–1245)

## 2020-04-09 LAB — VITAMIN D 25 HYDROXY (VIT D DEFICIENCY, FRACTURES): Vit D, 25-Hydroxy: 15.9 ng/mL — ABNORMAL LOW (ref 30.0–100.0)

## 2020-04-09 LAB — TSH: TSH: 0.821 u[IU]/mL (ref 0.450–4.500)

## 2020-04-09 MED ORDER — VITAMIN D (ERGOCALCIFEROL) 1.25 MG (50000 UNIT) PO CAPS: 50000.0000 [IU] | ORAL_CAPSULE | ORAL | 0 refills | Status: DC

## 2020-04-09 NOTE — Addendum Note (Signed)
Addended by: Rema Fendt on: 04/09/2020 09:17 PM   Modules accepted: Orders

## 2020-04-09 NOTE — Progress Notes (Addendum)
Please call patient with update.   Cholesterol normal.   Thyroid normal.  Vitamin B12 normal.  No pre-diabetes/diabetes.  Hepatitis C negative.   Blood type: A  Your vitamin D level came back low. For some people, low vitamin D can make you feel like you have no energy. An prescription for Ergocalciferol 50,000 IU was sent to your pharmacy. You are to take 1 pill per week for the next 12 weeks. After completing the weekly dose you can then take OTC Vitamin D 1,000-2,000 IU daily. We can then recheck your Vitamin D level at the completion of 12 weeks.   Follow-up with primary provider as needed.

## 2020-04-21 ENCOUNTER — Telehealth: Payer: Self-pay | Admitting: Family

## 2020-04-21 NOTE — Telephone Encounter (Signed)
Spoke to pt in regards to blood type which is A. Reviewed labs from February with pt

## 2020-04-21 NOTE — Telephone Encounter (Signed)
Pt is wondering what is her blood type. Pt said a My Chart response would be great or a call. Whatever is best. Thank you

## 2020-04-22 ENCOUNTER — Other Ambulatory Visit: Payer: Self-pay

## 2020-04-22 ENCOUNTER — Ambulatory Visit (HOSPITAL_COMMUNITY)
Admission: RE | Admit: 2020-04-22 | Discharge: 2020-04-22 | Disposition: A | Discharge status: Home / Self Care | Payer: 59 | Source: Ambulatory Visit | Attending: Family | Admitting: Family

## 2020-04-22 DIAGNOSIS — M899 Disorder of bone, unspecified: Secondary | ICD-10-CM | POA: Insufficient documentation

## 2020-04-23 NOTE — Progress Notes (Signed)
Referral placed to Orthopedic Surgery and Oncology related to right femur lesion.   Keep appointment with Gynecology scheduled 05/12/2020 related to left ovarian cyst.

## 2020-04-23 NOTE — Addendum Note (Signed)
Addended by: Rema Fendt on: 04/23/2020 04:55 PM   Modules accepted: Orders

## 2020-04-24 ENCOUNTER — Telehealth: Payer: Self-pay | Admitting: Family

## 2020-04-24 NOTE — Telephone Encounter (Signed)
Pt states she has no idea why she had MRI or what results are & pt unaware of referrals placed.  Please call pt [phone]

## 2020-04-29 ENCOUNTER — Ambulatory Visit (INDEPENDENT_AMBULATORY_CARE_PROVIDER_SITE_OTHER): Payer: 59 | Admitting: Orthopaedic Surgery

## 2020-04-29 ENCOUNTER — Encounter: Payer: Self-pay | Admitting: Physician Assistant

## 2020-04-29 ENCOUNTER — Other Ambulatory Visit: Payer: Self-pay

## 2020-04-29 DIAGNOSIS — M899 Disorder of bone, unspecified: Secondary | ICD-10-CM

## 2020-04-29 NOTE — Telephone Encounter (Signed)
Spoke w/pt aware of why referrals were placed. Orthopedic appt today
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
## 2020-04-29 NOTE — Telephone Encounter (Signed)
Pt uregently request return call. Pt states referrals visible in MyChart but has not been told she is being referred or what results were that prompted provider to refer.  Pt [phone].

## 2020-04-29 NOTE — Telephone Encounter (Signed)
Please inform patient referral placed to Orthopedic Surgery and Oncology related to right femur lesion from MRI on 04/22/2020.  Also, encouraged to keep appointment with Gynecology scheduled 05/12/2020 related to left ovarian cyst.

## 2020-04-30 ENCOUNTER — Other Ambulatory Visit: Payer: Self-pay

## 2020-04-30 DIAGNOSIS — M899 Disorder of bone, unspecified: Secondary | ICD-10-CM

## 2020-05-07 ENCOUNTER — Telehealth: Payer: Self-pay | Admitting: *Deleted

## 2020-05-07 NOTE — Telephone Encounter (Signed)
I called pt and lvm stating [facility] with [facility] does not take her Friday health plan insurance, I am trying to find orthopedics who will take it but also advised her to contact her isnurance as well and see in network.

## 2020-05-12 ENCOUNTER — Encounter: Payer: 59 | Admitting: Medical

## 2020-05-15 ENCOUNTER — Telehealth: Payer: Self-pay | Admitting: Family

## 2020-05-15 NOTE — Telephone Encounter (Signed)
Pt is requesting a refill for the following med.  Vitamin D, Ergocalciferol, (DRISDOL) 1.25 MG (50000 UNIT) CAPS capsule  [facility] on [address] [place], [place] [address]  Please advise and thank you

## 2020-05-18 ENCOUNTER — Other Ambulatory Visit: Payer: Self-pay | Admitting: Family

## 2020-05-18 DIAGNOSIS — E559 Vitamin D deficiency, unspecified: Secondary | ICD-10-CM

## 2020-05-18 MED ORDER — VITAMIN D (ERGOCALCIFEROL) 1.25 MG (50000 UNIT) PO CAPS: 50000.0000 [IU] | ORAL_CAPSULE | ORAL | 0 refills | Status: AC

## 2020-05-18 NOTE — Telephone Encounter (Signed)
Pt called to check status of refill.  Advised of previous response. Pt voiced concern she could not see the response in MyChart. Pt will follow instruction for OTC vitamin D.

## 2020-05-26 ENCOUNTER — Encounter: Payer: 59 | Admitting: Family Medicine

## 2020-07-03 ENCOUNTER — Encounter: Payer: 59 | Admitting: Family Medicine

## 2020-08-10 ENCOUNTER — Other Ambulatory Visit (HOSPITAL_COMMUNITY)
Admission: RE | Admit: 2020-08-10 | Discharge: 2020-08-10 | Disposition: A | Discharge status: Home / Self Care | Payer: 59 | Source: Ambulatory Visit | Attending: Medical | Admitting: Medical

## 2020-08-10 ENCOUNTER — Encounter: Payer: Self-pay | Admitting: Family Medicine

## 2020-08-10 ENCOUNTER — Ambulatory Visit (INDEPENDENT_AMBULATORY_CARE_PROVIDER_SITE_OTHER): Payer: 59 | Admitting: Family Medicine

## 2020-08-10 ENCOUNTER — Other Ambulatory Visit: Payer: Self-pay

## 2020-08-10 VITALS — BP 122/75 | HR 60 | Ht 64.0 in | Wt 160.8 lb

## 2020-08-10 DIAGNOSIS — Z113 Encounter for screening for infections with a predominantly sexual mode of transmission: Secondary | ICD-10-CM

## 2020-08-10 DIAGNOSIS — D573 Sickle-cell trait: Secondary | ICD-10-CM | POA: Diagnosis not present

## 2020-08-10 DIAGNOSIS — Z124 Encounter for screening for malignant neoplasm of cervix: Secondary | ICD-10-CM | POA: Diagnosis not present

## 2020-08-10 NOTE — Progress Notes (Signed)
Subjective:     Paige Mack is a 30 y.o. female and is here for a comprehensive physical exam. The patient reports no problems. Reports regular cycles.   The following portions of the patient's history were reviewed and updated as appropriate: allergies, current medications, past family history, past medical history, past social history, past surgical history, and problem list.  Review of Systems Pertinent items noted in HPI and remainder of comprehensive ROS otherwise negative.   Objective:    BP 122/75   Pulse 60   Ht 5\' 4"  (1.626 m)   Wt 160 lb 12.8 oz (72.9 kg)   LMP 07/31/2020 (Approximate)   BMI 27.60 kg/m  General appearance: alert, cooperative, and appears stated age Head: Normocephalic, without obvious abnormality, atraumatic Neck: no adenopathy, supple, symmetrical, trachea midline, and thyroid not enlarged, symmetric, no tenderness/mass/nodules Lungs: clear to auscultation bilaterally Breasts: normal appearance, no masses or tenderness Heart: regular rate and rhythm, S1, S2 normal, no murmur, click, rub or gallop Abdomen: soft, non-tender; bowel sounds normal; no masses,  no organomegaly Pelvic: cervix normal in appearance, external genitalia normal, no adnexal masses or tenderness, no cervical motion tenderness, uterus normal size, shape, and consistency, and vagina normal without discharge Extremities: extremities normal, atraumatic, no cyanosis or edema Pulses: 2+ and symmetric Skin: Skin color, texture, turgor normal. No rashes or lesions Lymph nodes: Cervical, supraclavicular, and axillary nodes normal. Neurologic: Grossly normal    Assessment:    Healthy female exam.      Plan:  Cervical cancer screening - Plan: Cytology - PAP( [place])  Screening for STD (sexually transmitted disease) - Plan: HIV Antibody (routine testing w rflx), RPR, Hepatitis B Surface AntiGEN, Hepatitis C Antibody  Sickle cell trait (HCC)    See After Visit Summary for Counseling  Recommendations

## 2020-08-10 NOTE — Patient Instructions (Signed)
Preventive Care 21-29 Years Old, Female Preventive care refers to lifestyle choices and visits with your health care provider that can promote health and wellness. This includes: A yearly physical exam. This is also called an annual wellness visit. Regular dental and eye exams. Immunizations. Screening for certain conditions. Healthy lifestyle choices, such as: Eating a healthy diet. Getting regular exercise. Not using drugs or products that contain nicotine and tobacco. Limiting alcohol use. What can I expect for my preventive care visit? Physical exam Your health care provider may check your: Height and weight. These may be used to calculate your BMI (body mass index). BMI is a measurement that tells if you are at a healthy weight. Heart rate and blood pressure. Body temperature. Skin for abnormal spots. Counseling Your health care provider may ask you questions about your: Past medical problems. Family's medical history. Alcohol, tobacco, and drug use. Emotional well-being. Home life and relationship well-being. Sexual activity. Diet, exercise, and sleep habits. Work and work environment. Access to firearms. Method of birth control. Menstrual cycle. Pregnancy history. What immunizations do I need?  Vaccines are usually given at various ages, according to a schedule. Your health care provider will recommend vaccines for you based on your age, medicalhistory, and lifestyle or other factors, such as travel or where you work. What tests do I need?  Blood tests Lipid and cholesterol levels. These may be checked every 5 years starting at age 20. Hepatitis C test. Hepatitis B test. Screening Diabetes screening. This is done by checking your blood sugar (glucose) after you have not eaten for a while (fasting). STD (sexually transmitted disease) testing, if you are at risk. BRCA-related cancer screening. This may be done if you have a family history of breast, ovarian, tubal, or  peritoneal cancers. Pelvic exam and Pap test. This may be done every 3 years starting at age 21. Starting at age 30, this may be done every 5 years if you have a Pap test in combination with an HPV test. Talk with your health care provider about your test results, treatment options,and if necessary, the need for more tests. Follow these instructions at home: Eating and drinking  Eat a healthy diet that includes fresh fruits and vegetables, whole grains, lean protein, and low-fat dairy products. Take vitamin and mineral supplements as recommended by your health care provider. Do not drink alcohol if: Your health care provider tells you not to drink. You are pregnant, may be pregnant, or are planning to become pregnant. If you drink alcohol: Limit how much you have to 0-1 drink a day. Be aware of how much alcohol is in your drink. In the [place] one drink equals one 12 oz bottle of beer (355 mL), one 5 oz glass of wine (148 mL), or one 1 oz glass of hard liquor (44 mL).  Lifestyle Take daily care of your teeth and gums. Brush your teeth every morning and night with fluoride toothpaste. Floss one time each day. Stay active. Exercise for at least 30 minutes 5 or more days each week. Do not use any products that contain nicotine or tobacco, such as cigarettes, e-cigarettes, and chewing tobacco. If you need help quitting, ask your health care provider. Do not use drugs. If you are sexually active, practice safe sex. Use a condom or other form of protection to prevent STIs (sexually transmitted infections). If you do not wish to become pregnant, use a form of birth control. If you plan to become pregnant, see your health care   provider for a prepregnancy visit. Find healthy ways to cope with stress, such as: Meditation, yoga, or listening to music. Journaling. Talking to a trusted person. Spending time with friends and family. Safety Always wear your seat belt while driving or riding in a  vehicle. Do not drive: If you have been drinking alcohol. Do not ride with someone who has been drinking. When you are tired or distracted. While texting. Wear a helmet and other protective equipment during sports activities. If you have firearms in your house, make sure you follow all gun safety procedures. Seek help if you have been physically or sexually abused. What's next? Go to your health care provider once a year for an annual wellness visit. Ask your health care provider how often you should have your eyes and teeth checked. Stay up to date on all vaccines. This information is not intended to replace advice given to you by your health care provider. Make sure you discuss any questions you have with your healthcare provider. Document Revised: 09/29/2019 Document Reviewed: 10/12/2017 Elsevier Patient Education  2022 [employer].

## 2020-08-11 LAB — CYTOLOGY - PAP
Chlamydia: NEGATIVE
Comment: NEGATIVE
Comment: NEGATIVE
Comment: NORMAL
Diagnosis: NEGATIVE
Neisseria Gonorrhea: NEGATIVE
Trichomonas: NEGATIVE

## 2020-08-11 LAB — HIV ANTIBODY (ROUTINE TESTING W REFLEX): HIV Screen 4th Generation wRfx: NONREACTIVE

## 2020-08-11 LAB — HEPATITIS B SURFACE ANTIGEN: Hepatitis B Surface Ag: NEGATIVE

## 2020-08-11 LAB — RPR: RPR Ser Ql: NONREACTIVE

## 2020-08-11 LAB — HEPATITIS C ANTIBODY: Hep C Virus Ab: <0.1 s/co ratio (ref 0.0–0.9)

## 2020-09-13 IMAGING — US US PELVIS COMPLETE TRANSABD/TRANSVAG W DUPLEX
2 series · 13 of 25 positions shown · non-contrast
Comparison: It has

CLINICAL DATA: Pelvic pain.  LMP 08/12/2018.

EXAM:
TRANSABDOMINAL AND TRANSVAGINAL ULTRASOUND OF PELVIS
DOPPLER ULTRASOUND OF OVARIES
TECHNIQUE: Both transabdominal and transvaginal ultrasound examinations of the
pelvis were performed. Transabdominal technique was performed for
global imaging of the pelvis including uterus, ovaries, adnexal
regions, and pelvic cul-de-sac.
It was necessary to proceed with endovaginal exam following the
transabdominal exam to visualize the uterus, endometrium, ovaries,
adnexal regions. Color and duplex Doppler ultrasound was utilized to
evaluate blood flow to the ovaries.

[Series 1: us pelvis complete transabd/transvag w duplex · 2 of 31 slices shown (1 of 2)]
[im 1/31]
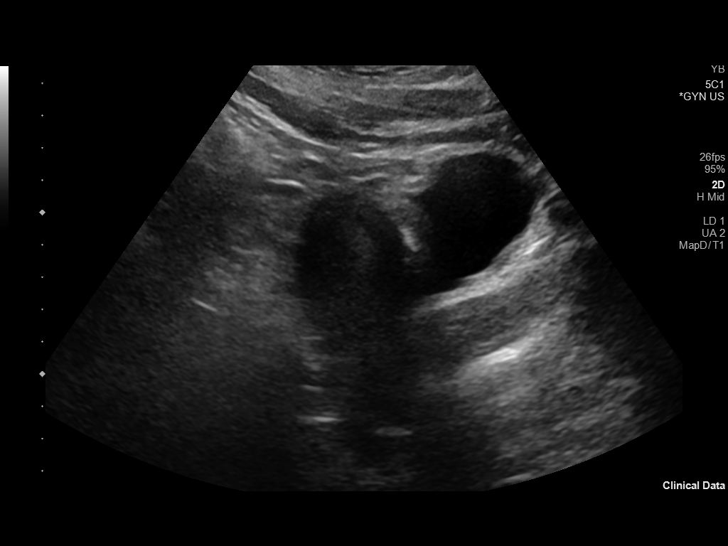
[im 21/31]
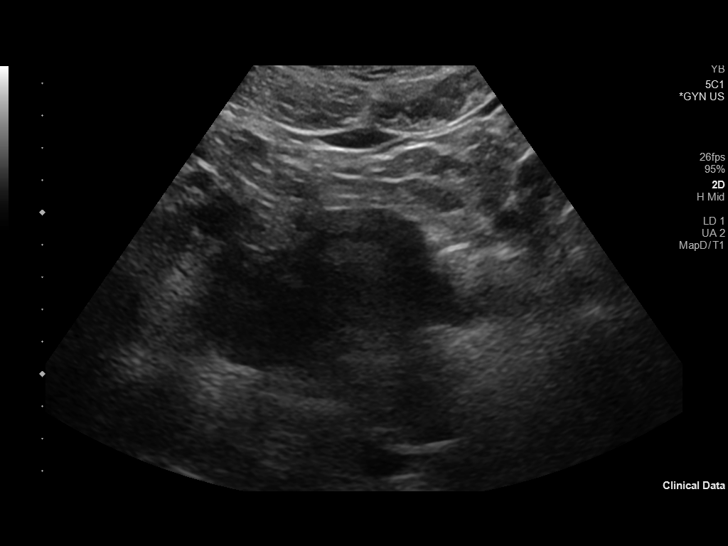

[Series 2: us pelvis complete transabd/transvag w duplex · 11 of 153 slices shown (2 of 2)]
[im 1/153]
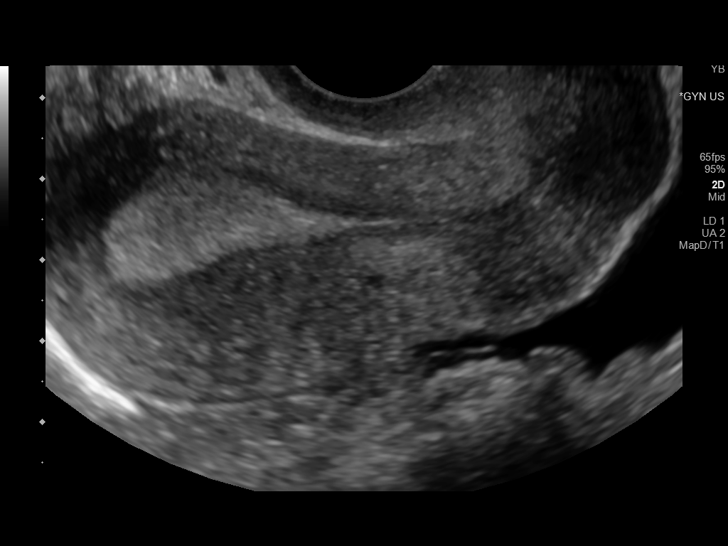
[im 16/153]
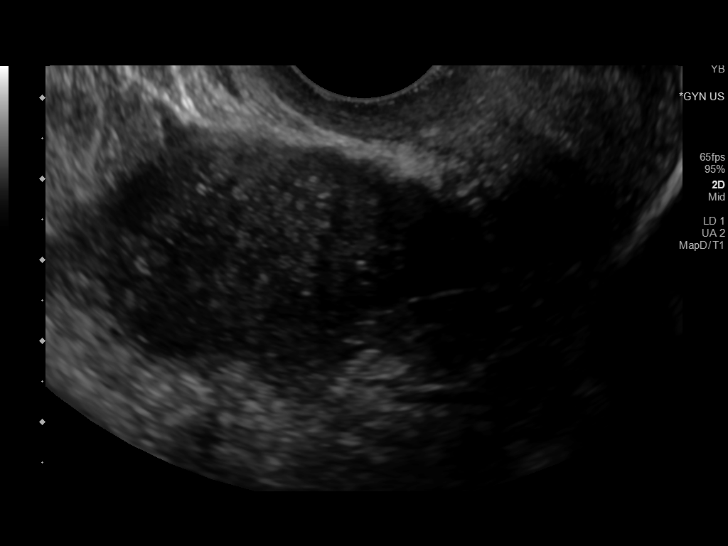
[im 31/153]
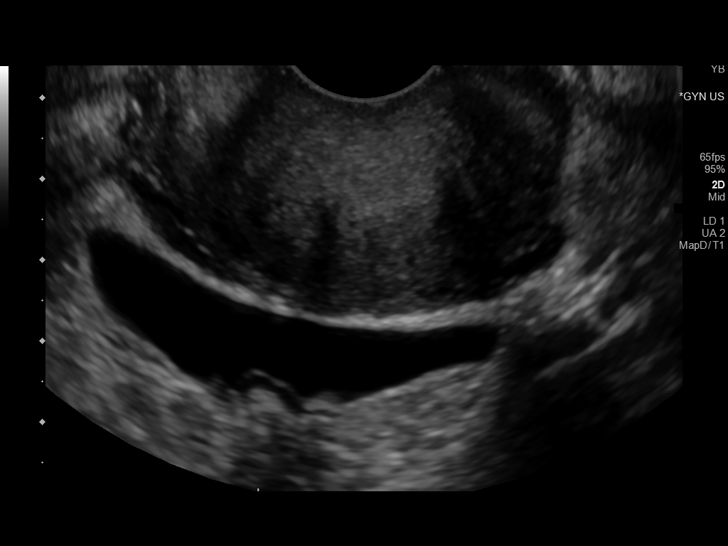
[im 46/153]
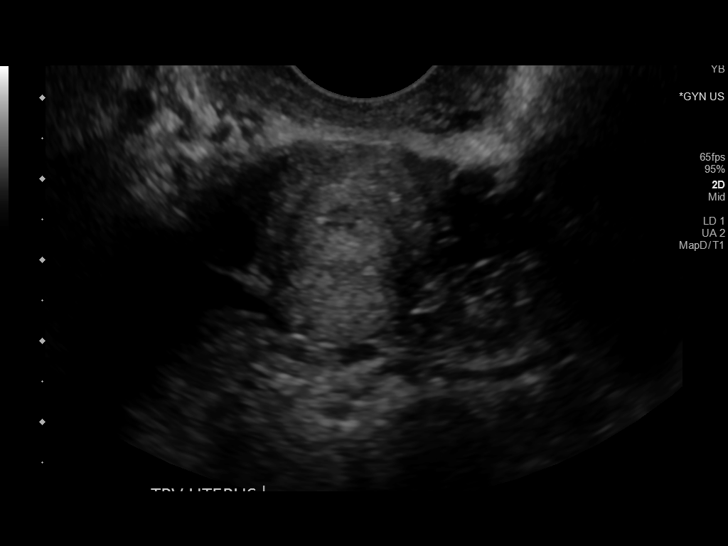
[im 61/153]
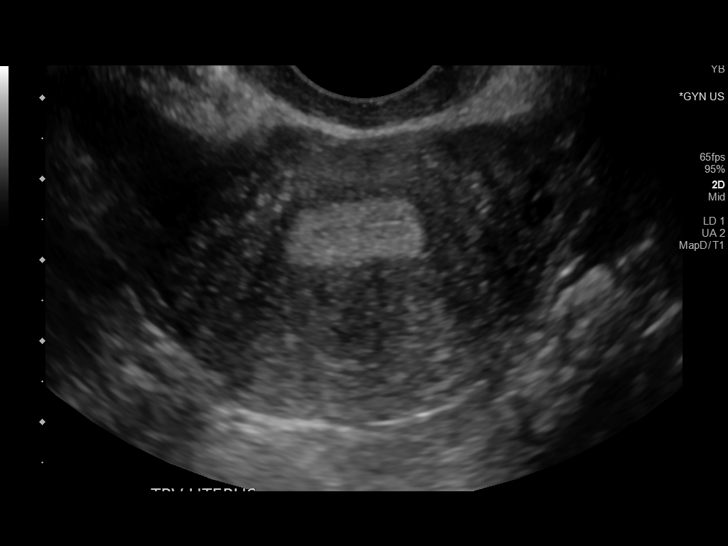
[im 77/153]
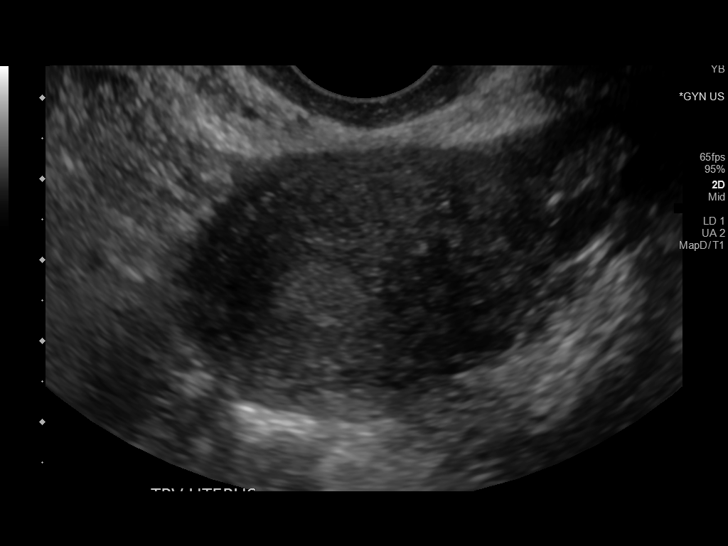
[im 92/153]
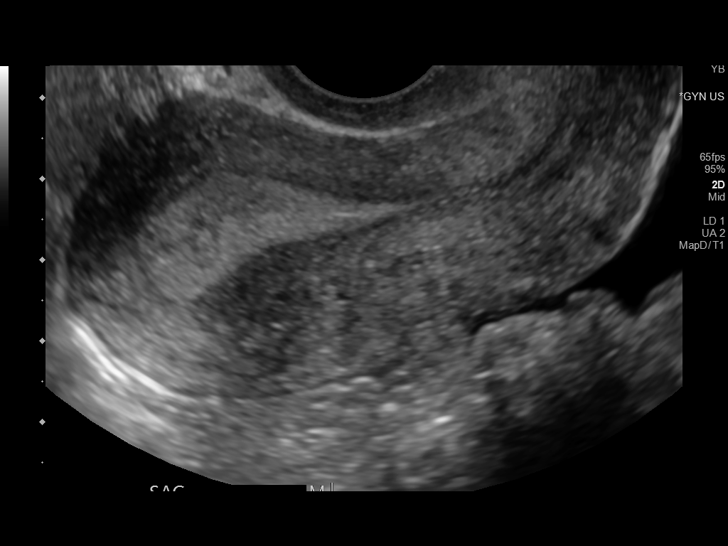
[im 107/153]
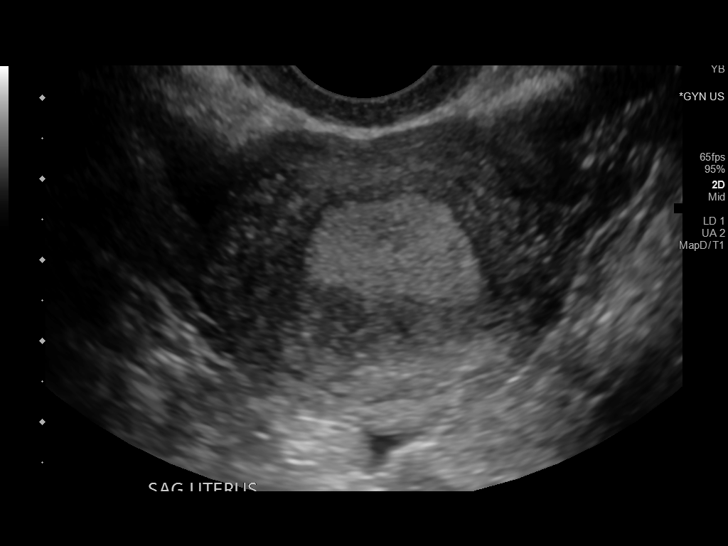
[im 122/153]
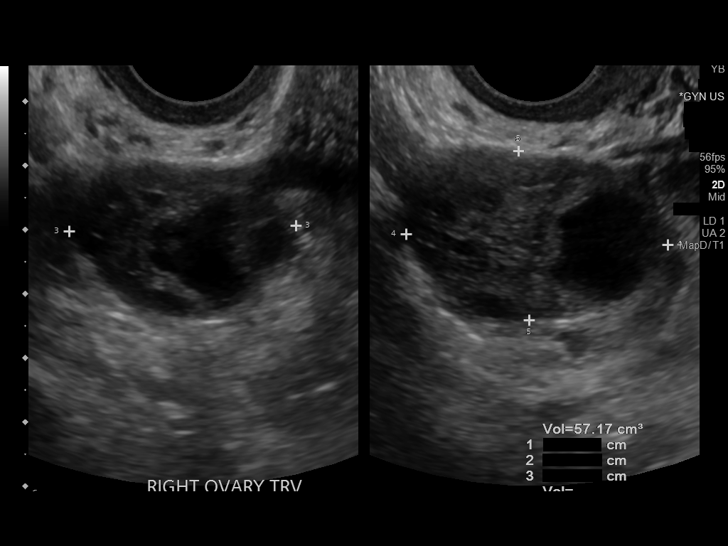
[im 137/153]
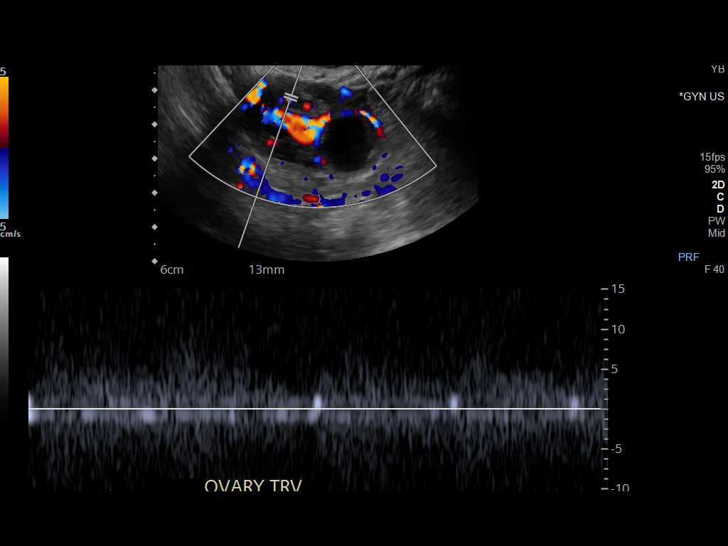
[im 153/153]
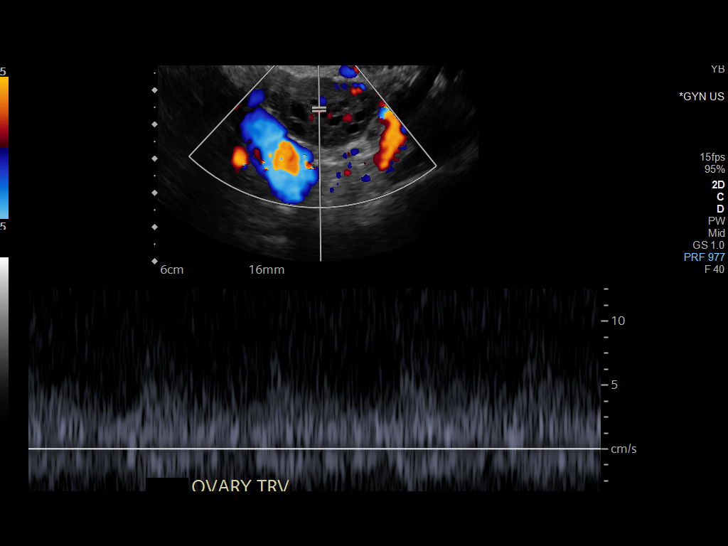

[13 of 25 positions shown; findings below may reference images not displayed]

FINDINGS: Uterus

Measurements: 8.0 x 3.9 x 4.6 centimeters = volume: 75.1 mL. No
fibroids or other mass visualized.

Endometrium

Thickness: 12 millimeters.  No focal abnormality visualized.

Right ovary

Measurements: 4.1 x 3.5 x 2.6 centimeters = volume: 19.5 mL. A
cyst/follicle in the RIGHT ovary is4.2 x 1.2 x 1.1 centimeters.

Left ovary

Measurements: 3.0 x 2.1 x 2.8 centimeter = volume: 9.2 mL. Normal
appearance/no adnexal mass.

Pulsed Doppler evaluation of both ovaries demonstrates normal
low-resistance arterial and venous waveforms.

Other findings

Small amount of free pelvic fluid.
IMPRESSION: 1. Normal appearance of the uterus and ovaries.
2. No evidence for ovarian torsion or adnexal mass.

## 2020-11-27 ENCOUNTER — Other Ambulatory Visit (HOSPITAL_COMMUNITY)
Admission: RE | Admit: 2020-11-27 | Discharge: 2020-11-27 | Disposition: A | Discharge status: Home / Self Care | Payer: 59 | Source: Ambulatory Visit | Attending: Family | Admitting: Family

## 2020-11-27 DIAGNOSIS — R399 Unspecified symptoms and signs involving the genitourinary system: Secondary | ICD-10-CM | POA: Insufficient documentation

## 2020-12-06 NOTE — Progress Notes (Signed)
Patient ID: Paige Mack, female    DOB: [birth year]  MRN: [id number]  CC: Frequent Urination   Subjective: Paige Mack is a 30 y.o. female who presents for frequent urination.   Her concerns today include:   FREQUENT URINATION: Ongoing for several days. She is sexually active. Reports menses began today and had one episode of vomiting.   2. IRON CHECK: Would like to restart iron supplements again related to being tired related to sickle cell trait. Took iron pills in the past and caused constipation.    Patient Active Problem List   Diagnosis Date Noted   Sickle cell trait (HCC) 08/10/2020   Vitamin D deficiency 04/09/2020     Current Outpatient Medications on File Prior to Visit  Medication Sig Dispense Refill   Multiple Vitamin (MULTIVITAMIN) tablet Take 1 tablet by mouth daily.     No current facility-administered medications on file prior to visit.    No Known Allergies  Social History   Socioeconomic History   Marital status: Single    Spouse name: Not on file   Number of children: Not on file   Years of education: Not on file   Highest education level: Not on file  Occupational History   Not on file  Tobacco Use   Smoking status: Never    Passive exposure: Yes   Smokeless tobacco: Never  Vaping Use   Vaping Use: Never used  Substance and Sexual Activity   Alcohol use: Not Currently    Comment: socially   Drug use: Yes    Types: Marijuana   Sexual activity: Yes    Birth control/protection: None  Other Topics Concern   Not on file  Social History Narrative   Not on file   Social Determinants of Health   Financial Resource Strain: Not on file  Food Insecurity: No Food Insecurity   Worried About Running Out of Food in the Last Year: Never true   Ran Out of Food in the Last Year: Never true  Transportation Needs: No Transportation Needs   Lack of Transportation (Medical): No   Lack of Transportation (Non-Medical): No  Physical Activity: Not on  file  Stress: Not on file  Social Connections: Not on file  Intimate Partner Violence: Not on file    Family History  Problem Relation Age of Onset   Hypertension Mother    Healthy Father     Past Surgical History:  Procedure Laterality Date   NO PAST SURGERIES      ROS: Review of Systems Negative except as stated above  PHYSICAL EXAM: BP 131/85   Pulse 71   Ht 5\' 4"  (1.626 m)   Wt 165 lb (74.8 kg)   LMP 12/07/2020   SpO2 98%   BMI 28.32 kg/m   Physical Exam HENT:     Head: Normocephalic and atraumatic.  Eyes:     Extraocular Movements: Extraocular movements intact.     Conjunctiva/sclera: Conjunctivae normal.     Pupils: Pupils are equal, round, and reactive to light.  Cardiovascular:     Rate and Rhythm: Normal rate and regular rhythm.     Pulses: Normal pulses.     Heart sounds: Normal heart sounds.  Pulmonary:     Effort: Pulmonary effort is normal.     Breath sounds: Normal breath sounds.  Musculoskeletal:     Cervical back: Normal range of motion and neck supple.  Neurological:     General: No focal deficit present.  Mental Status: She is alert and oriented to person, place, and time.  Psychiatric:        Mood and Affect: Mood normal.        Behavior: Behavior normal.    Results for orders placed or performed in visit on 12/07/20  POCT URINALYSIS DIP (CLINITEK)  Result Value Ref Range   Color, UA yellow yellow   Clarity, UA clear clear   Glucose, UA negative negative mg/dL   Bilirubin, UA negative negative   Ketones, POC UA negative negative mg/dL   Spec Grav, UA 2.993 7.169 - 1.025   Blood, UA small (A) negative   pH, UA 6.0 5.0 - 8.0   POC PROTEIN,UA negative negative, trace   Urobilinogen, UA 0.2 0.2 or 1.0 E.U./dL   Nitrite, UA Negative Negative   Leukocytes, UA Negative Negative    ASSESSMENT AND PLAN: 1. Urinary frequency: - No urinary tract infection.  - Cervicovaginal self-swab to screen for chlamydia, gonorrhea, trichomonas,  bacterial vaginitis, and candida vaginitis. - Follow-up with primary provider as scheduled. - POCT URINALYSIS DIP (CLINITEK) - Cervicovaginal ancillary only  2. Screening for deficiency anemia: - CBC to screen for anemia. - CBC   Patient was given the opportunity to ask questions.  Patient verbalized understanding of the plan and was able to repeat key elements of the plan. Patient was given clear instructions to go to Emergency Department or return to medical center if symptoms don't improve, worsen, or new problems develop.The patient verbalized understanding.   Orders Placed This Encounter  Procedures   CBC   POCT URINALYSIS DIP (CLINITEK)    Follow-up with primary provider as scheduled.  Rema Fendt, NP

## 2020-12-07 ENCOUNTER — Encounter: Payer: Self-pay | Admitting: Family

## 2020-12-07 ENCOUNTER — Other Ambulatory Visit: Payer: Self-pay

## 2020-12-07 ENCOUNTER — Ambulatory Visit (INDEPENDENT_AMBULATORY_CARE_PROVIDER_SITE_OTHER): Payer: 59 | Admitting: Family

## 2020-12-07 VITALS — BP 131/85 | HR 71 | Ht 64.0 in | Wt 165.0 lb

## 2020-12-07 DIAGNOSIS — R350 Frequency of micturition: Secondary | ICD-10-CM

## 2020-12-07 DIAGNOSIS — Z130 Encounter for screening for diseases of the blood and blood-forming organs and certain disorders involving the immune mechanism: Secondary | ICD-10-CM

## 2020-12-07 DIAGNOSIS — R399 Unspecified symptoms and signs involving the genitourinary system: Secondary | ICD-10-CM | POA: Diagnosis present

## 2020-12-07 LAB — POCT URINALYSIS DIP (CLINITEK)
Bilirubin, UA: NEGATIVE
Glucose, UA: NEGATIVE mg/dL
Ketones, POC UA: NEGATIVE mg/dL
Leukocytes, UA: NEGATIVE
Nitrite, UA: NEGATIVE
POC PROTEIN,UA: NEGATIVE
Spec Grav, UA: 1.02 (ref 1.010–1.025)
Urobilinogen, UA: 0.2 E.U./dL
pH, UA: 6 (ref 5.0–8.0)

## 2020-12-07 NOTE — Progress Notes (Signed)
Request Rx for Iron tablets to help with energy r/t Sickle Cell.

## 2020-12-07 NOTE — Progress Notes (Signed)
No urinary tract infection.

## 2020-12-08 ENCOUNTER — Other Ambulatory Visit: Payer: 59

## 2020-12-08 LAB — CERVICOVAGINAL ANCILLARY ONLY
Bacterial Vaginitis (gardnerella): POSITIVE — AB
Candida Glabrata: NEGATIVE
Candida Vaginitis: NEGATIVE
Chlamydia: NEGATIVE
Comment: NEGATIVE
Comment: NEGATIVE
Comment: NEGATIVE
Comment: NEGATIVE
Comment: NEGATIVE
Comment: NORMAL
Neisseria Gonorrhea: NEGATIVE
Trichomonas: NEGATIVE

## 2020-12-08 LAB — CBC
Hematocrit: 39.8 % (ref 34.0–46.6)
Hemoglobin: 13.6 g/dL (ref 11.1–15.9)
MCH: 28.4 pg (ref 26.6–33.0)
MCHC: 34.2 g/dL (ref 31.5–35.7)
MCV: 83 fL (ref 79–97)
Platelets: 218 10*3/uL (ref 150–450)
RBC: 4.79 x10E6/uL (ref 3.77–5.28)
RDW: 14.7 % (ref 11.7–15.4)
WBC: 6.4 10*3/uL (ref 3.4–10.8)

## 2020-12-08 NOTE — Progress Notes (Signed)
No anemia.   May continue over-the-counter iron supplements as needed. Reminder to include iron-rich foods in diet such as dark green leafy vegetables and dried fruit such as raisins and apricots.

## 2020-12-08 NOTE — Progress Notes (Signed)
Gonorrhea, Chlamydia, Trichomonas, and Candida Vaginitis (sometimes called yeast infection) negative.   Positive for Bacterial Vaginitis, an overgrowth of normal bacteria in the vagina. Prescribed Metronidazole (Flagyl) twice per day for 7 days. Do not drink alcohol while taking this medication.

## 2020-12-09 ENCOUNTER — Telehealth: Payer: Self-pay | Admitting: Family

## 2020-12-09 ENCOUNTER — Other Ambulatory Visit: Payer: Self-pay | Admitting: Family

## 2020-12-09 DIAGNOSIS — B9689 Other specified bacterial agents as the cause of diseases classified elsewhere: Secondary | ICD-10-CM

## 2020-12-09 DIAGNOSIS — N760 Acute vaginitis: Secondary | ICD-10-CM | POA: Insufficient documentation

## 2020-12-09 MED ORDER — METRONIDAZOLE 500 MG PO TABS: 500.0000 mg | ORAL_TABLET | Freq: Two times a day (BID) | ORAL | 0 refills | Status: AC

## 2020-12-09 NOTE — Telephone Encounter (Signed)
Pt stated she received a call from Nurse Genella Rife regarding some labwork and was now asking what pharmacy and what medications are the ones her PCP sent for her to pick up. Pt has questions for a nurse regarding her results. Please advise and thank you.

## 2020-12-25 NOTE — Progress Notes (Signed)
Erroneous encounter

## 2020-12-29 ENCOUNTER — Encounter: Payer: 59 | Admitting: Family

## 2020-12-31 NOTE — Progress Notes (Signed)
Patient ID: Paige Mack, female    DOB: 05/15/1990  MRN: [id number]  CC: PCOS   Subjective: Paige Mack is a 30 y.o. female who presents for PCOS.  Her concerns today include:  PCOS / ENDOMETRIOSIS CONCERNS: Patient reports concerns for possible PCOS and endometriosis. Recently saw a video on TikTok which elicited concern. Planning to have children in her 30's. Endorses hair growth on chin for the past 4 years. Has painful periods once monthly lasting 5 to 7 days with varying menstrual flow. Abnormal sweating at night has tried cooling fans and air conditioning without relief. Frequent bloating worse during menses. Denies changes in skin. Not aware of any family history of PCOS or endometriosis.   2. ADHD: Reports history of ADHD during childhood. Was taking Concerta and quit prior to going to college.  Reports academic challenges in college as she was no longer taking medication. Disliked that the medication decreased her appetite significantly even after dosage reductions without success. Beginning a new job soon in [employer], [employer] and would like to restart medication, reports decreased focus.   Patient Active Problem List   Diagnosis Date Noted   Bacterial vaginitis 12/09/2020   Sickle cell trait (HCC) 08/10/2020   Vitamin D deficiency 04/09/2020     Current Outpatient Medications on File Prior to Visit  Medication Sig Dispense Refill   Multiple Vitamin (MULTIVITAMIN) tablet Take 1 tablet by mouth daily.     No current facility-administered medications on file prior to visit.    No Known Allergies  Social History   Socioeconomic History   Marital status: Single    Spouse name: Not on file   Number of children: Not on file   Years of education: Not on file   Highest education level: Not on file  Occupational History   Not on file  Tobacco Use   Smoking status: Never    Passive exposure: Yes   Smokeless tobacco: Never  Vaping Use   Vaping Use: Never used   Substance and Sexual Activity   Alcohol use: Not Currently    Comment: socially   Drug use: Yes    Types: Marijuana   Sexual activity: Yes    Birth control/protection: None  Other Topics Concern   Not on file  Social History Narrative   Not on file   Social Determinants of Health   Financial Resource Strain: Not on file  Food Insecurity: No Food Insecurity   Worried About Running Out of Food in the Last Year: Never true   Ran Out of Food in the Last Year: Never true  Transportation Needs: No Transportation Needs   Lack of Transportation (Medical): No   Lack of Transportation (Non-Medical): No  Physical Activity: Not on file  Stress: Not on file  Social Connections: Not on file  Intimate Partner Violence: Not on file    Family History  Problem Relation Age of Onset   Hypertension Mother    Healthy Father     Past Surgical History:  Procedure Laterality Date   NO PAST SURGERIES      ROS: Review of Systems Negative except as stated above  PHYSICAL EXAM: BP 136/73 (BP Location: Left Arm, Patient Position: Sitting, Cuff Size: Normal)   Pulse 81   Temp 98.5 F (36.9 C)   Resp 18   Ht 5' 4.04" (1.627 m)   Wt 170 lb 9.6 oz (77.4 kg)   LMP 12/07/2020   SpO2 95%   BMI 29.25 kg/m  Physical Exam HENT:     Head: Normocephalic and atraumatic.  Eyes:     Extraocular Movements: Extraocular movements intact.     Conjunctiva/sclera: Conjunctivae normal.     Pupils: Pupils are equal, round, and reactive to light.  Cardiovascular:     Rate and Rhythm: Normal rate and regular rhythm.     Pulses: Normal pulses.     Heart sounds: Normal heart sounds.  Pulmonary:     Effort: Pulmonary effort is normal.     Breath sounds: Normal breath sounds.  Musculoskeletal:     Cervical back: Normal range of motion and neck supple.  Neurological:     General: No focal deficit present.     Mental Status: She is alert and oriented to person, place, and time.  Psychiatric:         Mood and Affect: Mood normal.        Behavior: Behavior normal.    ASSESSMENT AND PLAN: 1. PCOS (polycystic ovarian syndrome): 2. Endometriosis: - Patient concern for possible PCOS and endometriosis.  - Referral to Gynecology for further evaluation and management.  - Ambulatory referral to Gynecology  3. History of ADHD: - Referral to Psychology for further evaluation and management. - Ambulatory referral to Psychology    Patient was given the opportunity to ask questions.  Patient verbalized understanding of the plan and was able to repeat key elements of the plan. Patient was given clear instructions to go to Emergency Department or return to medical center if symptoms don't improve, worsen, or new problems develop.The patient verbalized understanding.   Orders Placed This Encounter  Procedures   Ambulatory referral to Gynecology   Ambulatory referral to Psychology    Follow-up with primary provider as scheduled.  Rema Fendt, NP

## 2021-01-01 ENCOUNTER — Other Ambulatory Visit: Payer: Self-pay

## 2021-01-01 ENCOUNTER — Ambulatory Visit (INDEPENDENT_AMBULATORY_CARE_PROVIDER_SITE_OTHER): Payer: 59 | Admitting: Family

## 2021-01-01 ENCOUNTER — Encounter: Payer: Self-pay | Admitting: Family

## 2021-01-01 VITALS — BP 136/73 | HR 81 | Temp 98.5°F | Resp 18 | Ht 64.04 in | Wt 170.6 lb

## 2021-01-01 DIAGNOSIS — Z8659 Personal history of other mental and behavioral disorders: Secondary | ICD-10-CM | POA: Diagnosis not present

## 2021-01-01 DIAGNOSIS — N809 Endometriosis, unspecified: Secondary | ICD-10-CM | POA: Diagnosis not present

## 2021-01-01 DIAGNOSIS — E282 Polycystic ovarian syndrome: Secondary | ICD-10-CM | POA: Diagnosis not present

## 2021-01-01 NOTE — Patient Instructions (Signed)
Polycystic Ovary Syndrome Polycystic ovarian syndrome (PCOS) is a common hormonal disorder among women of reproductive age. In most women with PCOS, small fluid-filled sacs (cysts) grow on the ovaries. PCOS can cause problems with menstrual periods and make it hard to get and stay pregnant. If this condition is not treated, it can lead to serious health problems, such as diabetes and heart disease. What are the causes? The cause of this condition is not known. It may be due to certain factors, such as: Irregular menstrual cycle. High levels of certain hormones. Problems with the hormone that helps to control blood sugar (insulin). Certain genes. What increases the risk? You are more likely to develop this condition if you: Have a family history of PCOS or type 2 diabetes. Are overweight, eat unhealthy foods, and are not active. These factors may cause problems with blood sugar control, which can contribute to PCOS or PCOS symptoms. What are the signs or symptoms? Symptoms of this condition include: Ovarian cysts and sometimes pelvic pain. Menstrual periods that are not regular or are too heavy. Inability to get or stay pregnant. Increased growth of hair on the face, chest, stomach, back, thumbs, thighs, or toes. Acne or oily skin. Acne may develop during adulthood, and it may not get better with treatment. Weight gain or obesity. Patches of thickened and dark brown or black skin on the neck, arms, breasts, or thighs. How is this diagnosed? This condition is diagnosed based on: Your medical history. A physical exam that includes a pelvic exam. Your health care provider may look for areas of increased hair growth on your skin. Tests, such as: An ultrasound to check the ovaries for cysts and to view the lining of the uterus. Blood tests to check levels of sugar (glucose), female hormone (testosterone), and female hormones (estrogen and progesterone). How is this treated? There is no cure for  this condition, but treatment can help to manage symptoms and prevent more health problems from developing. Treatment varies depending on your symptoms and if you want to have a baby or if you need birth control. Treatment may include: Making nutrition and lifestyle changes. Taking the progesterone hormone to start a menstrual period. Taking birth control pills to help you have regular menstrual periods. Taking medicines such as: Medicines to make you ovulate, if you want to get pregnant. Medicine to reduce extra hair growth. Having surgery in severe cases. This may involve making small holes in one or both of your ovaries. This decreases the amount of testosterone that your body makes. Follow these instructions at home: Take over-the-counter and prescription medicines only as told by your health care provider. Follow a healthy meal plan that includes lean proteins, complex carbohydrates, fresh fruits and vegetables, low-fat dairy products, healthy fats, and fiber. If you are overweight, lose weight as told by your health care provider. Your health care provider can determine how much weight loss is best for you and can help you lose weight safely. Keep all follow-up visits. This is important. Contact a health care provider if: Your symptoms do not get better with medicine. Your symptoms get worse or you develop new symptoms. Summary Polycystic ovarian syndrome (PCOS) is a common hormonal disorder among women of reproductive age. PCOS can cause problems with menstrual periods and make it hard to get and stay pregnant. If this condition is not treated, it can lead to serious health problems, such as diabetes and heart disease. There is no cure for this condition, but treatment can  help to manage symptoms and prevent more health problems from developing. This information is not intended to replace advice given to you by your health care provider. Make sure you discuss any questions you have with  your health care provider. Document Revised: 07/11/2019 Document Reviewed: 07/11/2019 Elsevier Patient Education  2022 [employer].  Endometriosis Endometriosis is a condition in which tissue that forms the lining of the uterus grows in places outside the uterus. This tissue can grow in the organs that create the eggs (ovaries), in the tubes that carry the eggs to the uterus (fallopian tubes), in the vagina, and in the bowel. This tissue most often grows on the ovaries and inner lining of the pelvic cavity (peritoneum). What are the causes? The cause of this condition is not known. What increases the risk? The following factors may make you more likely to develop this condition: Having a family history of endometriosis. Having never given birth. Starting your menstrual period at age 10 or younger. What are the signs or symptoms? Often, there are no symptoms of this condition. If you do have symptoms, they may include: Heavier bleeding during menstrual periods. Menstrual periods that happen more than once a month. Not being able to get pregnant. Pain in the area between your hip bones (pelvis). Pain during sex. Pain in the back or abdomen. Painful bowel movements and urination during menstrual periods. Rarely, you may see blood in your stool or urine. The timing of symptoms may vary, depending on where the abnormal tissue is growing. They may happen during your menstrual period (most often) or at the middle of your cycle. They may come and go. You may have no symptoms during some months. They may stop when you no longer have your monthly periods (menopause). How is this diagnosed? This condition is diagnosed based on your symptoms and a physical exam. You may also have tests, such as: Blood tests and urine tests to help rule out other causes. Ultrasound to look for tissues that are not normal. This is often done over your skin (transabdominal). It is sometimes done through the vagina  (transvaginal). X-ray of the lower bowel (barium enema). CT scan. MRI. To confirm the diagnosis, your health care provider may use a device with a small camera to check tissue inside your abdomen (laparoscopy). Abnormal tissue may be removed and checked in a lab (biopsy). How is this treated? There is no cure for this condition. The treatment goal is to control your symptoms. The type of treatment also depends on whether you want to become pregnant in the future. This condition may be treated with: Medicines. These may include: Medicines to relieve pain, including NSAIDs, such as ibuprofen. Hormone therapy, such as birth control pills, to slow the growth of abnormal tissue. Surgery to remove the abnormal tissue. During surgery, the following may happen: Tissue may be removed using a laparoscope and a laser (laparoscopic laser treatment). The ovaries, fallopian tubes, and uterus may be removed (hysterectomy). This is done in very severe cases. Follow these instructions at home: Medicines Take over-the-counter and prescription medicines only as told by your health care provider. Ask your health care provider if the medicine prescribed to you: Requires you to avoid driving or using machinery. Can cause constipation. You may need to take these actions to prevent or treat constipation: Drink enough fluid to keep your urine pale yellow. Take over-the-counter or prescription medicines. Eat foods that are high in fiber, such as beans, whole grains, and fresh fruits and  vegetables. Limit foods that are high in fat and processed sugars, such as fried or sweet foods. Eating and drinking If you drink alcohol: Limit how much you have to 0-1 drink a day for women who are not pregnant. Know how much alcohol is in your drink. In the [place] one drink equals one 12 oz bottle of beer (355 mL), one 5 oz glass of wine (148 mL), or one 1 oz glass of hard liquor (44 mL). Avoid caffeine. Activity Return to your  normal activities as told by your health care provider. Ask your health care provider what activities are safe for you. Do exercises as told by your health care provider. General instructions Do not use any products that contain nicotine or tobacco. These products include cigarettes, chewing tobacco, and vaping devices, such as e-cigarettes. If you need help quitting, ask your health care provider. Keep all follow-up visits. This is important. Where to find more information [employer]: www.acog.org Office on [employer] [facility]: http://hoffman.com/ Contact a health care provider if: You have new pain or trouble controlling pain. You have problems getting pregnant. You have a fever. Get help right away if: You have severe pain that does not get better with medicine. You have severe nausea and vomiting, or you cannot eat or drink without vomiting. You have pain in your abdomen only on the lower right side. Pain in your abdomen gets worse. You have swelling in your abdomen. You have blood in your stool. Summary Endometriosis is a condition that happens when tissue that forms the lining of the uterus grows in places outside the uterus. The cause of this condition is not known. This condition may be treated with medicines to relieve pain, hormone therapy, or surgery. If you have this condition, get regular exercise, limit alcohol use, and avoid caffeine. Get help right away if you have severe pain that does not get better with medicine, severe nausea and vomiting, pain or swelling in your abdomen, or blood in your stool. This information is not intended to replace advice given to you by your health care provider. Make sure you discuss any questions you have with your health care provider. Document Revised: 09/04/2019 Document Reviewed: 09/04/2019 Elsevier Patient Education  2022 [employer].

## 2021-01-01 NOTE — Progress Notes (Signed)
Pt presents for referral to gynecology for concerns of PCOS and endometriosis, also wants referral for therapist was talking Concerta back in 2012 and feels she needs to start regimen again

## 2021-08-16 DIAGNOSIS — E559 Vitamin D deficiency, unspecified: Secondary | ICD-10-CM | POA: Diagnosis not present

## 2021-08-16 DIAGNOSIS — Z118 Encounter for screening for other infectious and parasitic diseases: Secondary | ICD-10-CM | POA: Diagnosis not present

## 2021-08-16 DIAGNOSIS — Z113 Encounter for screening for infections with a predominantly sexual mode of transmission: Secondary | ICD-10-CM | POA: Diagnosis not present

## 2021-08-16 DIAGNOSIS — Z0000 Encounter for general adult medical examination without abnormal findings: Secondary | ICD-10-CM | POA: Diagnosis not present

## 2021-08-18 DIAGNOSIS — A549 Gonococcal infection, unspecified: Secondary | ICD-10-CM | POA: Diagnosis not present

## 2021-08-18 DIAGNOSIS — E559 Vitamin D deficiency, unspecified: Secondary | ICD-10-CM | POA: Diagnosis not present

## 2022-02-23 IMAGING — CT CT RENAL STONE PROTOCOL
2 of 4 series · 16 of 46 positions shown, 18 images · non-contrast
Comparison: None.

CLINICAL DATA: 29-year-old female with left flank pain.

EXAM:
CT ABDOMEN AND PELVIS WITHOUT CONTRAST
TECHNIQUE: Multidetector CT imaging of the abdomen and pelvis was performed
following the standard protocol without IV contrast.

[Series 2: axial st · axial · 0.74mm/px · z∈[+490,+890]mm · 13 of 88 slices shown, 15 images]
[im 4/88  soft-tissue]
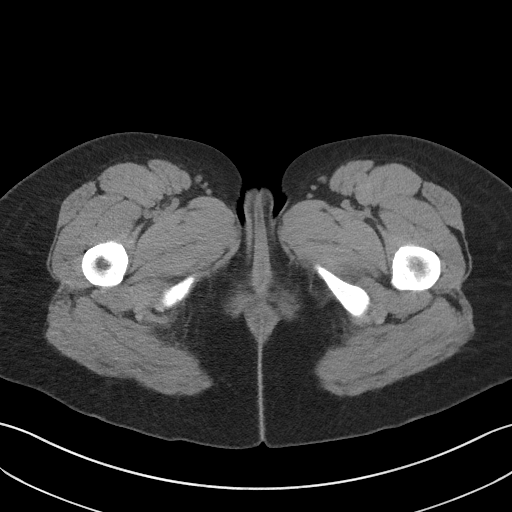
[im 4/88  bone]
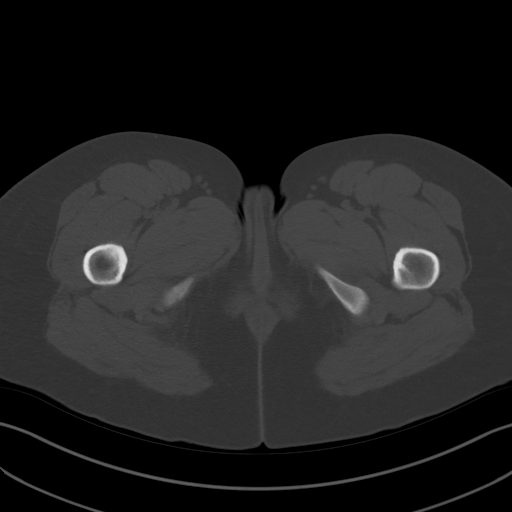
[im 12/88  soft-tissue]
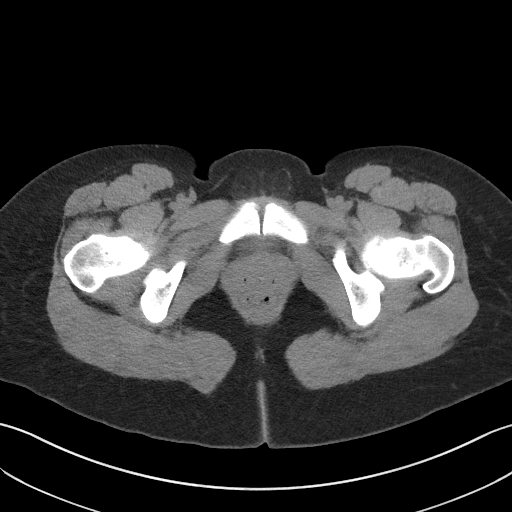
[im 20/88  soft-tissue]
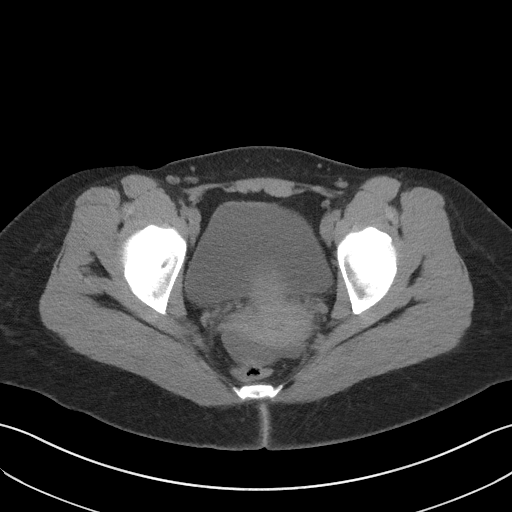
[im 24/88  soft-tissue]
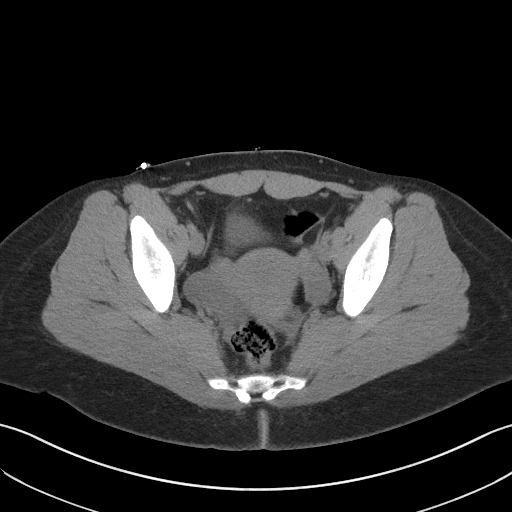
[im 32/88  soft-tissue]
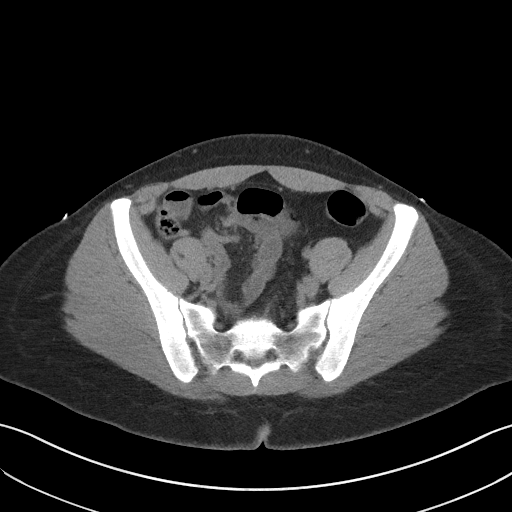
[im 36/88  soft-tissue]
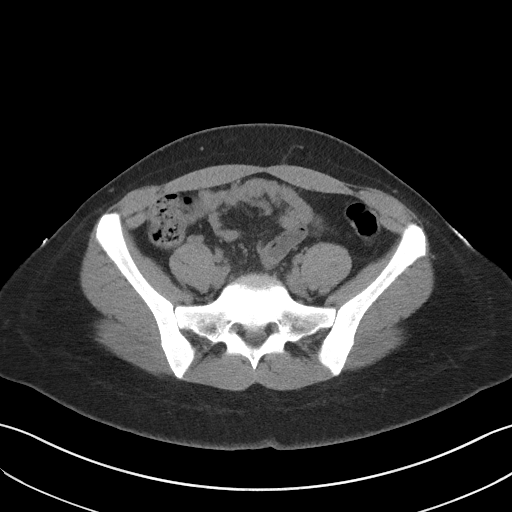
[im 44/88  soft-tissue]
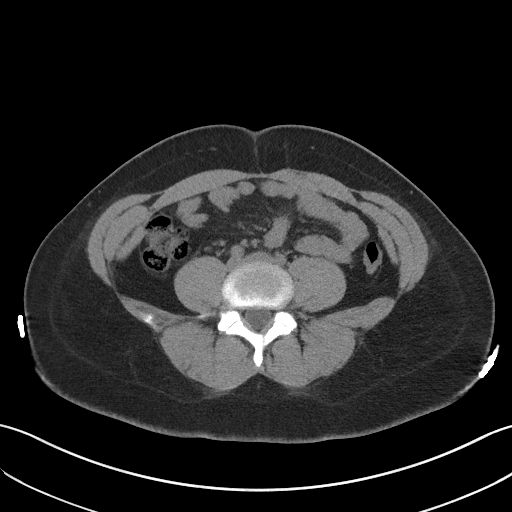
[im 52/88  soft-tissue]
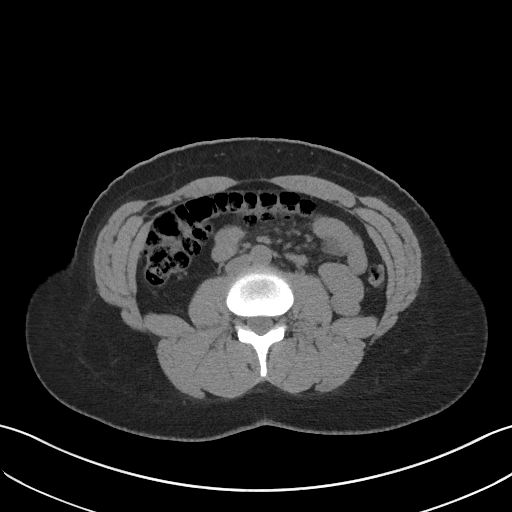
[im 56/88  soft-tissue]
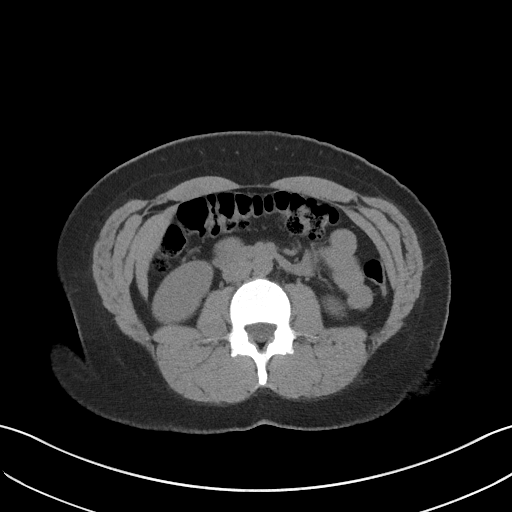
[im 56/88  bone]
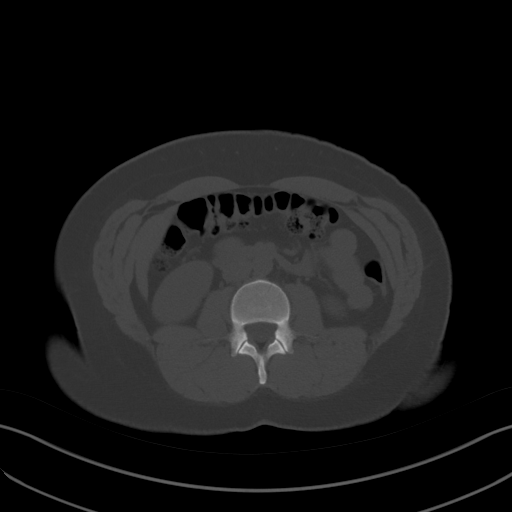
[im 64/88  soft-tissue]
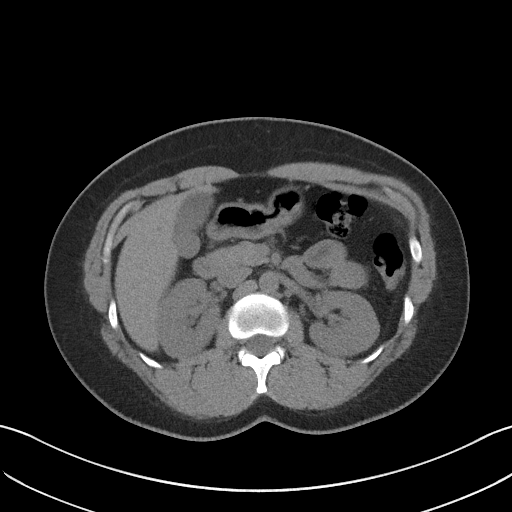
[im 68/88  soft-tissue]
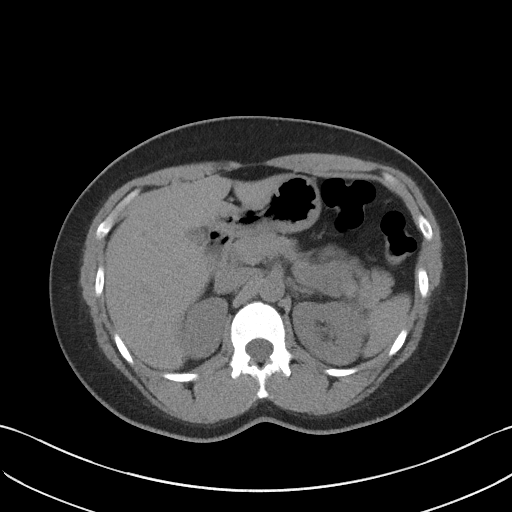
[im 76/88  soft-tissue]
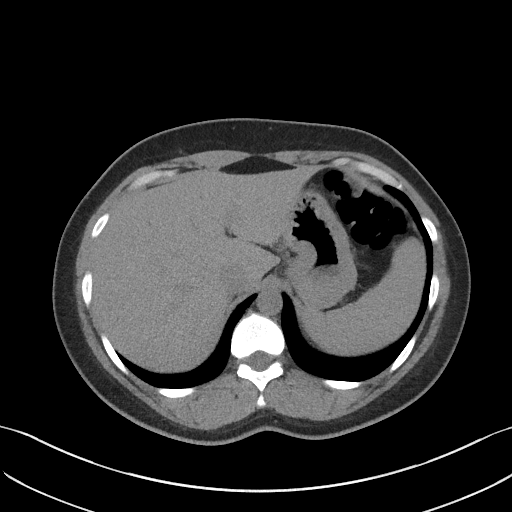
[im 84/88  soft-tissue]
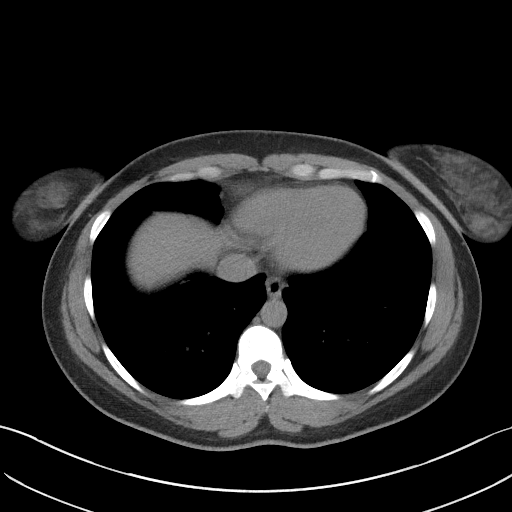

[Series 5: coronal st · coronal · 0.75mm/px · 3 of 81 slices shown]
[im 27/81  soft-tissue]
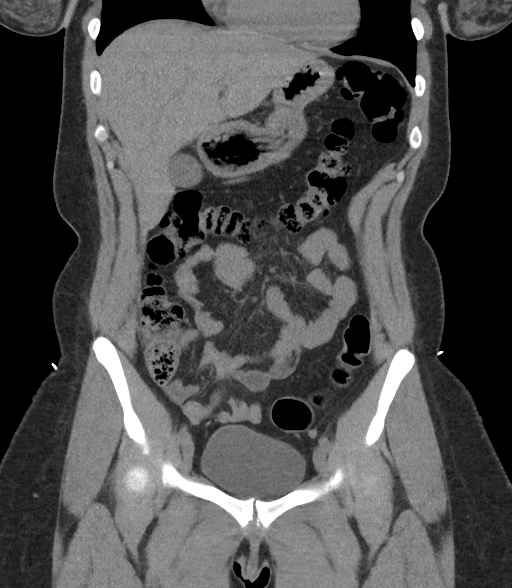
[im 36/81  soft-tissue]
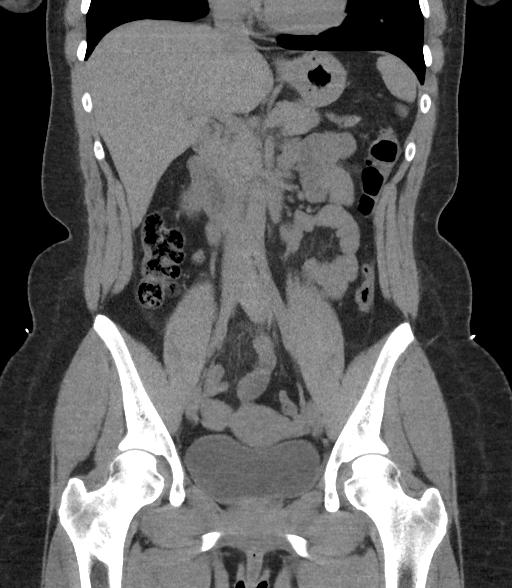
[im 45/81  soft-tissue]
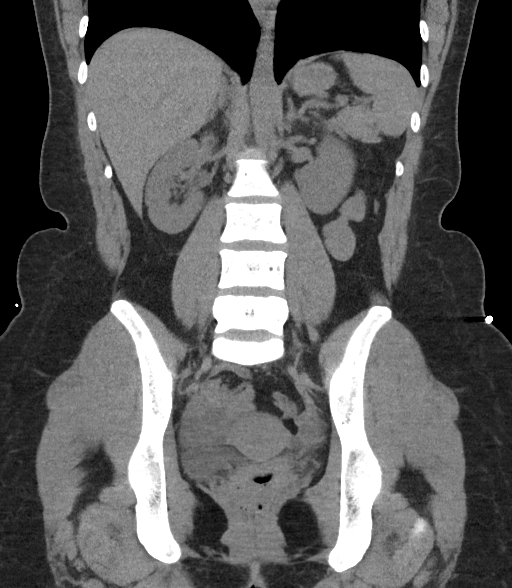

[16 of 46 positions shown; findings below may reference images not displayed]

FINDINGS: Evaluation of this exam is limited in the absence of intravenous
contrast.

Lower chest: The visualized lung bases are clear.

No intra-abdominal free air. Small free fluid in the pelvis.

Hepatobiliary: The liver is unremarkable. No intrahepatic biliary
dilatation. The gallbladder is unremarkable

Pancreas: Unremarkable. No pancreatic ductal dilatation or
surrounding inflammatory changes.

Spleen: Normal in size without focal abnormality.

Adrenals/Urinary Tract: The adrenal glands unremarkable the kidneys,
visualized ureters, and urinary bladder appear unremarkable.

Stomach/Bowel: There is no bowel obstruction or active inflammation.
The appendix is not visualized with certainty. No inflammatory
changes identified in the right lower quadrant.

Vascular/Lymphatic: The abdominal aorta and IVC unremarkable no
portal venous gas. There is no adenopathy.

Reproductive: The uterus is anteverted and grossly unremarkable.
Mildly enlarged appearance of the right ovary, likely related to
dominant follicle.

Other: None

Musculoskeletal: No acute osseous pathology. There is a 4.5 x 2.6 cm
osseous lesion with mixed lucent and sclerotic matrix and sharp
transition eccentrically located in the proximal metaphysis of the
right femur. No cortical breakage or soft tissue component. This is
not characterized but may represent a nonossifying fibroma or a
chondromyxoid fibroma. Further characterization with MRI on a
nonemergent basis recommended.
IMPRESSION: 1. No acute intra-abdominal or pelvic pathology. No hydronephrosis
or nephrolithiasis.
2. Mixed osseous lesion in the proximal right femur with no
aggressive features. Further characterization with MRI is
recommended.

## 2022-04-20 ENCOUNTER — Emergency Department (HOSPITAL_COMMUNITY): Payer: Self-pay

## 2022-04-20 ENCOUNTER — Other Ambulatory Visit: Payer: Self-pay

## 2022-04-20 ENCOUNTER — Encounter (HOSPITAL_COMMUNITY): Payer: Self-pay

## 2022-04-20 ENCOUNTER — Emergency Department (HOSPITAL_COMMUNITY)
Admission: EM | Admit: 2022-04-20 | Discharge: 2022-04-20 | Disposition: A | Discharge status: Home / Self Care | Payer: Self-pay | Attending: Emergency Medicine | Admitting: Emergency Medicine

## 2022-04-20 DIAGNOSIS — S7002XA Contusion of left hip, initial encounter: Secondary | ICD-10-CM

## 2022-04-20 DIAGNOSIS — R519 Headache, unspecified: Secondary | ICD-10-CM | POA: Insufficient documentation

## 2022-04-20 DIAGNOSIS — R030 Elevated blood-pressure reading, without diagnosis of hypertension: Secondary | ICD-10-CM

## 2022-04-20 DIAGNOSIS — Y92410 Unspecified street and highway as the place of occurrence of the external cause: Secondary | ICD-10-CM | POA: Diagnosis not present

## 2022-04-20 DIAGNOSIS — S79912A Unspecified injury of left hip, initial encounter: Secondary | ICD-10-CM | POA: Diagnosis present

## 2022-04-20 MED ORDER — IBUPROFEN 400 MG PO TABS
600.0000 mg | ORAL_TABLET | Freq: Once | ORAL | Status: AC
  Administered 2022-04-20: 600 mg via ORAL
  Filled 2022-04-20: qty 1

## 2022-04-20 NOTE — Discharge Instructions (Signed)
Your blood pressure is elevated today. You should recheck this at home, but also see your doctor for monitoring.   Apply ice to the left hip. You can take Ibuprofen and Tylenol for pain at home.   If you develop new or worsening pain, inability to walk, chest pain, headache, abdominal pain, back pain, numbness or weakness, or any other new/concerning symptoms then return to the ER or call 911.
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
